# Patient Record
Sex: Male | Born: 1995 | Race: Black or African American | Hispanic: No | Marital: Single | Smoking: Current every day smoker
Health system: Southern US, Community
[De-identification: ages and names within clinical notes are randomized; demographics above are authoritative.]

## PROBLEM LIST (undated history)

## (undated) DIAGNOSIS — F319 Bipolar disorder, unspecified: Secondary | ICD-10-CM

## (undated) DIAGNOSIS — F209 Schizophrenia, unspecified: Secondary | ICD-10-CM

## (undated) DIAGNOSIS — A499 Bacterial infection, unspecified: Secondary | ICD-10-CM

## (undated) DIAGNOSIS — R569 Unspecified convulsions: Secondary | ICD-10-CM

---
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
## 2015-01-21 ENCOUNTER — Emergency Department (HOSPITAL_COMMUNITY): Payer: No Typology Code available for payment source

## 2015-01-21 ENCOUNTER — Encounter (HOSPITAL_COMMUNITY): Payer: Self-pay | Admitting: Emergency Medicine

## 2015-01-21 ENCOUNTER — Emergency Department (HOSPITAL_COMMUNITY)
Admission: EM | Admit: 2015-01-21 | Discharge: 2015-01-21 | Disposition: A | Discharge status: Home / Self Care | Payer: Self-pay | Attending: Emergency Medicine | Admitting: Emergency Medicine

## 2015-01-21 DIAGNOSIS — Z8659 Personal history of other mental and behavioral disorders: Secondary | ICD-10-CM | POA: Insufficient documentation

## 2015-01-21 DIAGNOSIS — Z720 Tobacco use: Secondary | ICD-10-CM | POA: Insufficient documentation

## 2015-01-21 DIAGNOSIS — Y9302 Activity, running: Secondary | ICD-10-CM | POA: Insufficient documentation

## 2015-01-21 DIAGNOSIS — W010XXA Fall on same level from slipping, tripping and stumbling without subsequent striking against object, initial encounter: Secondary | ICD-10-CM | POA: Insufficient documentation

## 2015-01-21 DIAGNOSIS — Y998 Other external cause status: Secondary | ICD-10-CM | POA: Insufficient documentation

## 2015-01-21 DIAGNOSIS — Y9289 Other specified places as the place of occurrence of the external cause: Secondary | ICD-10-CM | POA: Insufficient documentation

## 2015-01-21 DIAGNOSIS — S92252A Displaced fracture of navicular [scaphoid] of left foot, initial encounter for closed fracture: Secondary | ICD-10-CM | POA: Insufficient documentation

## 2015-01-21 MED ORDER — IBUPROFEN 800 MG PO TABS
800.0000 mg | ORAL_TABLET | Freq: Once | ORAL | Status: AC
  Administered 2015-01-21: 800 mg via ORAL
  Filled 2015-01-21: qty 1

## 2015-01-21 NOTE — ED Notes (Signed)
Pt left in the custody of [facility] Police. Pt wheeled out of treatment area and to police car.

## 2015-01-21 NOTE — Discharge Instructions (Signed)
Tarsal Navicular Fracture Craig Hoffman, you have a small fracture of your foot.  See orthopaedic surgery within 3 days for close follow up.  Wear the cam walker boot at all times for protection.  If symptoms worsen, come back to the ED immediately.  Thank you. A fracture is a break in the bone. The tarsal navicular is a moderate sized bone of the midfoot on the inner side of the foot. This bone can be fractured in several ways and there are many different ways of treating these fractures. Some of the ways of treating these are as follows: TREATMENT   Immobilization, which means the fracture is casted as it is without changing the positions of the fracture (bone pieces) involved. This procedure is used when the bone fragments remain in their normal position (are not displaced).  ORIF (open reduction and internal fixation), in which the fracture site is opened and the bone pieces are fixed into place with some type of hardware (screws or pins). This type of repair is usually used when the body of the navicular is fractured. It is indicated in most fractures that are displaced. Your caregiver will discuss the type of fracture you have and the treatment that will be best for that problem. If surgery is the treatment of choice, the following is information for you to know and also let your caregiver know about prior to surgery.  LET YOUR CAREGIVER KNOW ABOUT:  Allergies.  Medicine taken including herbs, eye drops, over the counter medications, and creams.  Use of steroids (by mouth or creams)  Previous problems with anesthetics or novocaine.  Possibility of pregnancy, if this applies.  History of blood clots (thrombophlebitis).  History of bleeding or blood problems.  Previous surgery.  Other health problems.  Family history of anesthetic problems. AFTER THE PROCEDURE After surgery, you will be taken to the recovery area where a nurse will watch and check your progress. Once you are awake,  stable, and taking fluids well, barring other problems you will be allowed to go home. Once home, an ice pack applied to your operative site may help with discomfort and keep the swelling down. Elevate your foot above your heart as much as possible for the first 4-6 days after surgery. HOME CARE INSTRUCTIONS   Follow your caregiver's instructions as to activities, exercises, physical therapy, and driving a car.  Daily exercise is helpful to prevent return of problems. Maintain strength and range of motion as instructed.  Only take over-the-counter or prescription medicines for pain, discomfort, or fever as directed by your caregiver.  See your caregiver as directed. It is very important to keep all follow-up referrals and appointments in order to avoid any long-term problems with your ankle and foot including chronic pain, inability to move the ankle or foot normally, and permanent disability. SEEK MEDICAL CARE IF:   Increased bleeding (more than a small spot) from the wound or from beneath your cast or splint.  Redness, swelling, or increasing pain in the wound or from beneath your cast or splint.  Pus coming from wound or from beneath the cast, splint or fracture boot.  An unexplained oral temperature over 102 F (38.9 C).  A foul smell coming from the wound or dressing or from beneath your cast or splint. SEEK IMMEDIATE MEDICAL CARE IF:   You begin to lose feeling in your foot or toes, or develop swelling of the foot or toes.  You get a cold or blue foot or toes on  the injured side.  You develop pain not relieved by medicine.  You develop a rash.  You have difficulty breathing.  You have any allergic problems. If you do not have a window in your cast for observing the wound, a discharge or minor bleeding may show up as a stain on the outside of your cast. Report these findings to your caregiver. If you are placed into a fracture boot after your operation, you should expect minor  bleeding through the dressings. Change the dressings as instructed by your caregiver. Document Released: 08/14/2000 Document Revised: 07/25/2011 Document Reviewed: 07/05/2013 [facility] Patient Information 2015 [place], [place]. This information is not intended to replace advice given to you by your health care provider. Make sure you discuss any questions you have with your health care provider.

## 2015-01-21 NOTE — ED Notes (Signed)
Pt returned from xray
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
## 2015-01-27 ENCOUNTER — Emergency Department (HOSPITAL_COMMUNITY)
Admission: EM | Admit: 2015-01-27 | Discharge: 2015-01-27 | Disposition: A | Discharge status: Home / Self Care | Payer: No Typology Code available for payment source | Attending: Emergency Medicine | Admitting: Emergency Medicine

## 2015-01-27 ENCOUNTER — Encounter (HOSPITAL_COMMUNITY): Payer: Self-pay | Admitting: Vascular Surgery

## 2015-01-27 DIAGNOSIS — S92902A Unspecified fracture of left foot, initial encounter for closed fracture: Secondary | ICD-10-CM | POA: Insufficient documentation

## 2015-01-27 DIAGNOSIS — X58XXXA Exposure to other specified factors, initial encounter: Secondary | ICD-10-CM | POA: Insufficient documentation

## 2015-01-27 DIAGNOSIS — Y9302 Activity, running: Secondary | ICD-10-CM | POA: Insufficient documentation

## 2015-01-27 DIAGNOSIS — Z720 Tobacco use: Secondary | ICD-10-CM | POA: Insufficient documentation

## 2015-01-27 DIAGNOSIS — Y998 Other external cause status: Secondary | ICD-10-CM | POA: Insufficient documentation

## 2015-01-27 DIAGNOSIS — Z8659 Personal history of other mental and behavioral disorders: Secondary | ICD-10-CM | POA: Insufficient documentation

## 2015-01-27 DIAGNOSIS — Y9289 Other specified places as the place of occurrence of the external cause: Secondary | ICD-10-CM | POA: Insufficient documentation
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
## 2015-01-27 NOTE — Discharge Instructions (Signed)
Please use ibuprofen, Tylenol, ice, and follow-up with orthopedist for further evaluation and management

## 2015-01-27 NOTE — ED Notes (Signed)
Pt reports to the ED for eval of left ankle pain. He was seen here and had X-rays done and was told that he had a broken bone in his foot. Pt reports he was not given anything for pain and it has been hurting a lot. Pt A&OX4, resp e/u, and skin warm and dry.

## 2015-03-01 ENCOUNTER — Encounter (HOSPITAL_COMMUNITY): Payer: Self-pay | Admitting: *Deleted

## 2015-03-01 ENCOUNTER — Emergency Department (HOSPITAL_COMMUNITY): Payer: Medicaid Other

## 2015-03-01 ENCOUNTER — Inpatient Hospital Stay (HOSPITAL_COMMUNITY)
Admission: EM | Admit: 2015-03-01 | Discharge: 2015-03-06 | DRG: 552 | Disposition: A | Discharge status: Rehabilitation Facility | Payer: Medicaid Other | Attending: General Surgery | Admitting: General Surgery

## 2015-03-01 DIAGNOSIS — S129XXA Fracture of neck, unspecified, initial encounter: Secondary | ICD-10-CM | POA: Diagnosis present

## 2015-03-01 DIAGNOSIS — E871 Hypo-osmolality and hyponatremia: Secondary | ICD-10-CM | POA: Diagnosis present

## 2015-03-01 DIAGNOSIS — W3400XA Accidental discharge from unspecified firearms or gun, initial encounter: Secondary | ICD-10-CM | POA: Diagnosis not present

## 2015-03-01 DIAGNOSIS — I10 Essential (primary) hypertension: Secondary | ICD-10-CM | POA: Diagnosis present

## 2015-03-01 DIAGNOSIS — R270 Ataxia, unspecified: Secondary | ICD-10-CM | POA: Diagnosis present

## 2015-03-01 DIAGNOSIS — S14104A Unspecified injury at C4 level of cervical spinal cord, initial encounter: Secondary | ICD-10-CM | POA: Diagnosis present

## 2015-03-01 DIAGNOSIS — S12300B Unspecified displaced fracture of fourth cervical vertebra, initial encounter for open fracture: Principal | ICD-10-CM | POA: Diagnosis present

## 2015-03-01 DIAGNOSIS — F17200 Nicotine dependence, unspecified, uncomplicated: Secondary | ICD-10-CM | POA: Diagnosis present

## 2015-03-01 DIAGNOSIS — D72829 Elevated white blood cell count, unspecified: Secondary | ICD-10-CM | POA: Diagnosis present

## 2015-03-01 DIAGNOSIS — S1193XA Puncture wound without foreign body of unspecified part of neck, initial encounter: Secondary | ICD-10-CM

## 2015-03-01 DIAGNOSIS — T1490XA Injury, unspecified, initial encounter: Secondary | ICD-10-CM

## 2015-03-01 HISTORY — DX: Bacterial infection, unspecified: A49.9

## 2015-03-01 LAB — TYPE AND SCREEN
ABO/RH(D): B POS
Antibody Screen: NEGATIVE
UNIT DIVISION: 0
Unit division: 0

## 2015-03-01 LAB — CBC
HCT: 43.2 % (ref 39.0–52.0)
HEMOGLOBIN: 15 g/dL (ref 13.0–17.0)
MCH: 29.4 pg (ref 26.0–34.0)
MCHC: 34.7 g/dL (ref 30.0–36.0)
MCV: 84.5 fL (ref 78.0–100.0)
PLATELETS: 150 10*3/uL (ref 150–400)
RBC: 5.11 MIL/uL (ref 4.22–5.81)
RDW: 13.7 % (ref 11.5–15.5)
WBC: 6.5 10*3/uL (ref 4.0–10.5)

## 2015-03-01 LAB — COMPREHENSIVE METABOLIC PANEL
ALT: 24 U/L (ref 17–63)
AST: 25 U/L (ref 15–41)
Albumin: 4.3 g/dL (ref 3.5–5.0)
Alkaline Phosphatase: 49 U/L (ref 38–126)
Anion gap: 10 (ref 5–15)
BILIRUBIN TOTAL: 0.8 mg/dL (ref 0.3–1.2)
BUN: 7 mg/dL (ref 6–20)
CHLORIDE: 99 mmol/L — AB (ref 101–111)
CO2: 25 mmol/L (ref 22–32)
Calcium: 9.1 mg/dL (ref 8.9–10.3)
Creatinine, Ser: 1.25 mg/dL — ABNORMAL HIGH (ref 0.61–1.24)
Glucose, Bld: 97 mg/dL (ref 65–99)
POTASSIUM: 3.1 mmol/L — AB (ref 3.5–5.1)
Sodium: 134 mmol/L — ABNORMAL LOW (ref 135–145)
TOTAL PROTEIN: 6.7 g/dL (ref 6.5–8.1)

## 2015-03-01 LAB — PROTIME-INR
INR: 1.03 (ref 0.00–1.49)
PROTHROMBIN TIME: 13.7 s (ref 11.6–15.2)

## 2015-03-01 LAB — CDS SEROLOGY

## 2015-03-01 LAB — ETHANOL

## 2015-03-01 LAB — ABO/RH: ABO/RH(D): B POS

## 2015-03-01 MED ORDER — MORPHINE SULFATE (PF) 2 MG/ML IV SOLN
2.0000 mg | INTRAVENOUS | Status: DC | PRN
  Administered 2015-03-01 – 2015-03-02 (×4): 2 mg via INTRAVENOUS
  Filled 2015-03-01 (×4): qty 1

## 2015-03-01 MED ORDER — SODIUM CHLORIDE 0.9 % IV SOLN
INTRAVENOUS | Status: DC
  Administered 2015-03-01: 100 mL/h via INTRAVENOUS
  Administered 2015-03-02: 06:00:00 via INTRAVENOUS

## 2015-03-01 MED ORDER — OXYCODONE HCL 5 MG PO TABS
5.0000 mg | ORAL_TABLET | ORAL | Status: DC | PRN
  Administered 2015-03-01: 5 mg via ORAL
  Filled 2015-03-01: qty 1

## 2015-03-01 MED ORDER — ONDANSETRON HCL 4 MG/2ML IJ SOLN
4.0000 mg | Freq: Four times a day (QID) | INTRAMUSCULAR | Status: DC | PRN
  Administered 2015-03-01: 4 mg via INTRAVENOUS
  Filled 2015-03-01: qty 2

## 2015-03-01 MED ORDER — ONDANSETRON HCL 4 MG PO TABS: 4.0000 mg | ORAL_TABLET | Freq: Four times a day (QID) | ORAL | Status: DC | PRN

## 2015-03-01 MED ORDER — MORPHINE SULFATE (PF) 4 MG/ML IV SOLN
4.0000 mg | Freq: Once | INTRAVENOUS | Status: AC
  Administered 2015-03-01: 4 mg via INTRAVENOUS
  Filled 2015-03-01: qty 1

## 2015-03-01 MED ORDER — IOHEXOL 350 MG/ML SOLN
100.0000 mL | Freq: Once | INTRAVENOUS | Status: AC | PRN
  Administered 2015-03-01: 100 mL via INTRAVENOUS

## 2015-03-01 NOTE — ED Notes (Signed)
Police Archivistdetective at the bedside
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
## 2015-03-01 NOTE — ED Notes (Signed)
Family at the bedside.
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
## 2015-03-01 NOTE — ED Notes (Signed)
Pt returned from ct
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
## 2015-03-01 NOTE — ED Notes (Signed)
To ct

## 2015-03-01 NOTE — ED Notes (Signed)
Family updated as to patient's status.
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
## 2015-03-01 NOTE — ED Notes (Signed)
Pain med given for bi-lateral shoulder and arm pain

## 2015-03-01 NOTE — ED Notes (Signed)
Dr Venetia Maxonstern and dr Sheliah Hatchkinsinger at the bedsdie
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
## 2015-03-02 LAB — PREPARE FRESH FROZEN PLASMA
UNIT DIVISION: 0
Unit division: 0

## 2015-03-02 LAB — BASIC METABOLIC PANEL
ANION GAP: 9 (ref 5–15)
BUN: <5 mg/dL — ABNORMAL LOW (ref 6–20)
CALCIUM: 8.6 mg/dL — AB (ref 8.9–10.3)
CO2: 24 mmol/L (ref 22–32)
Chloride: 99 mmol/L — ABNORMAL LOW (ref 101–111)
Creatinine, Ser: 1.01 mg/dL (ref 0.61–1.24)
Glucose, Bld: 125 mg/dL — ABNORMAL HIGH (ref 65–99)
POTASSIUM: 3.7 mmol/L (ref 3.5–5.1)
Sodium: 132 mmol/L — ABNORMAL LOW (ref 135–145)

## 2015-03-02 LAB — CBC
HEMATOCRIT: 40.7 % (ref 39.0–52.0)
HEMOGLOBIN: 13.9 g/dL (ref 13.0–17.0)
MCH: 28.6 pg (ref 26.0–34.0)
MCHC: 34.2 g/dL (ref 30.0–36.0)
MCV: 83.7 fL (ref 78.0–100.0)
Platelets: 160 10*3/uL (ref 150–400)
RBC: 4.86 MIL/uL (ref 4.22–5.81)
RDW: 13.8 % (ref 11.5–15.5)
WBC: 10.6 10*3/uL — AB (ref 4.0–10.5)

## 2015-03-02 LAB — MRSA PCR SCREENING: MRSA by PCR: NEGATIVE

## 2015-03-02 LAB — BLOOD PRODUCT ORDER (VERBAL) VERIFICATION

## 2015-03-02 MED ORDER — HYDROMORPHONE HCL 1 MG/ML IJ SOLN
0.5000 mg | INTRAMUSCULAR | Status: DC | PRN
  Administered 2015-03-02 (×4): 0.5 mg via INTRAVENOUS
  Filled 2015-03-02 (×4): qty 1

## 2015-03-02 MED ORDER — PREGABALIN 25 MG PO CAPS
50.0000 mg | ORAL_CAPSULE | Freq: Two times a day (BID) | ORAL | Status: DC
  Administered 2015-03-02 – 2015-03-03 (×3): 50 mg via ORAL
  Filled 2015-03-02 (×2): qty 2
  Filled 2015-03-02: qty 1

## 2015-03-02 MED ORDER — HYDROMORPHONE HCL 1 MG/ML IJ SOLN
1.0000 mg | INTRAMUSCULAR | Status: DC | PRN
  Administered 2015-03-02 (×2): 1 mg via INTRAVENOUS
  Administered 2015-03-02 – 2015-03-03 (×3): 2 mg via INTRAVENOUS
  Filled 2015-03-02: qty 2
  Filled 2015-03-02: qty 1
  Filled 2015-03-02 (×2): qty 2
  Filled 2015-03-02: qty 1

## 2015-03-02 MED ORDER — OXYCODONE HCL 5 MG PO TABS
5.0000 mg | ORAL_TABLET | ORAL | Status: DC | PRN
  Administered 2015-03-02 – 2015-03-03 (×4): 10 mg via ORAL
  Filled 2015-03-02 (×4): qty 2

## 2015-03-02 NOTE — Progress Notes (Signed)
Patient states, "this medicine (morphine) is not doing anything." RN repositioned patient with no effect. MD Kinsinger notified. Orders to d/c PRN IV Morphine and order IV Dilaudid 0.5 mg Q1hr PRN
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
## 2015-03-02 NOTE — Progress Notes (Signed)
MD Kinsinger on unit. Notified that patient complained of increased numbing of both hands. No new orders at this time.

## 2015-03-02 NOTE — Progress Notes (Signed)
Rehab Admissions Coordinator Note:  Patient was screened by Trish MageLogue, Para Cossey M for appropriateness for an Inpatient Acute Rehab Consult.  At this time, we are recommending Inpatient Rehab consult.  Trish MageLogue, Shadae Reino M 03/02/2015, 3:15 PM  I can be reached at [phone].
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
## 2015-03-02 NOTE — Progress Notes (Signed)
Utilization review completed. Otie Headlee, RN, BSN. 

## 2015-03-03 DIAGNOSIS — R208 Other disturbances of skin sensation: Secondary | ICD-10-CM

## 2015-03-03 DIAGNOSIS — S14104A Unspecified injury at C4 level of cervical spinal cord, initial encounter: Secondary | ICD-10-CM | POA: Diagnosis present

## 2015-03-03 DIAGNOSIS — S1193XA Puncture wound without foreign body of unspecified part of neck, initial encounter: Secondary | ICD-10-CM

## 2015-03-03 DIAGNOSIS — S129XXA Fracture of neck, unspecified, initial encounter: Secondary | ICD-10-CM

## 2015-03-03 DIAGNOSIS — W3400XA Accidental discharge from unspecified firearms or gun, initial encounter: Secondary | ICD-10-CM

## 2015-03-03 MED ORDER — OXYCODONE HCL 5 MG PO TABS
10.0000 mg | ORAL_TABLET | ORAL | Status: DC | PRN
  Administered 2015-03-03 – 2015-03-04 (×5): 20 mg via ORAL
  Administered 2015-03-05: 10 mg via ORAL
  Administered 2015-03-05 (×3): 20 mg via ORAL
  Administered 2015-03-06: 10 mg via ORAL
  Administered 2015-03-06: 20 mg via ORAL
  Filled 2015-03-03 (×4): qty 4
  Filled 2015-03-03: qty 2
  Filled 2015-03-03: qty 4
  Filled 2015-03-03: qty 2
  Filled 2015-03-03: qty 4
  Filled 2015-03-03: qty 2
  Filled 2015-03-03 (×2): qty 4
  Filled 2015-03-03: qty 2

## 2015-03-03 MED ORDER — ENOXAPARIN SODIUM 40 MG/0.4ML ~~LOC~~ SOLN
40.0000 mg | SUBCUTANEOUS | Status: DC
  Administered 2015-03-03 – 2015-03-06 (×4): 40 mg via SUBCUTANEOUS
  Filled 2015-03-03 (×4): qty 0.4

## 2015-03-03 MED ORDER — POLYETHYLENE GLYCOL 3350 17 G PO PACK
17.0000 g | PACK | Freq: Every day | ORAL | Status: DC
  Administered 2015-03-03 – 2015-03-06 (×4): 17 g via ORAL
  Filled 2015-03-03 (×4): qty 1

## 2015-03-03 MED ORDER — PREGABALIN 75 MG PO CAPS
75.0000 mg | ORAL_CAPSULE | Freq: Two times a day (BID) | ORAL | Status: DC
  Administered 2015-03-03: 75 mg via ORAL
  Filled 2015-03-03: qty 1

## 2015-03-03 MED ORDER — HYDROMORPHONE HCL 1 MG/ML IJ SOLN
0.5000 mg | INTRAMUSCULAR | Status: DC | PRN
  Administered 2015-03-03 – 2015-03-06 (×7): 0.5 mg via INTRAVENOUS
  Filled 2015-03-03 (×8): qty 1

## 2015-03-03 MED ORDER — DOCUSATE SODIUM 100 MG PO CAPS
100.0000 mg | ORAL_CAPSULE | Freq: Two times a day (BID) | ORAL | Status: DC
  Administered 2015-03-03 – 2015-03-06 (×7): 100 mg via ORAL
  Filled 2015-03-03 (×8): qty 1

## 2015-03-03 NOTE — Progress Notes (Signed)
Inpatient Rehabilitation  I met with the patient, his mom and his grandmother at the bedside to discuss recommendation of IP Rehab for pt's rehab recovery.  Pt. Is agreeable for rehab, stating "why wouldn't I want to do it?".  Mom and grandmother supportive and in agreement.  Will follow up tomorrow for medical readiness and bed availability.  Please call if questions.  [facility] Admissions Coordinator Cell [phone] Office [phone]
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
## 2015-03-03 NOTE — Progress Notes (Signed)
PT Cancellation Note  Patient Details Name: Craig Hoffman MRN: [id number] DOB: 08/11/1995   Cancelled Treatment:    Reason Eval/Treat Not Completed: Patient at procedure or test/unavailable Pt meeting with inpatient rehab coordinator. Will follow up at next available time.   Blake DivineShauna A Thi Sisemore 03/03/2015, 3:20 PM  Mylo RedShauna Kymberlyn Eckford, PT, DPT [phone]
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
## 2015-03-04 ENCOUNTER — Encounter (HOSPITAL_COMMUNITY): Payer: Self-pay | Admitting: Physical Medicine and Rehabilitation

## 2015-03-04 MED ORDER — KETOROLAC TROMETHAMINE 30 MG/ML IJ SOLN
30.0000 mg | Freq: Once | INTRAMUSCULAR | Status: AC
  Administered 2015-03-04: 30 mg via INTRAVENOUS
  Filled 2015-03-04: qty 1

## 2015-03-04 MED ORDER — PREGABALIN 75 MG PO CAPS
100.0000 mg | ORAL_CAPSULE | Freq: Two times a day (BID) | ORAL | Status: DC
  Administered 2015-03-04 – 2015-03-06 (×5): 100 mg via ORAL
  Filled 2015-03-04 (×10): qty 1

## 2015-03-04 MED ORDER — TRAMADOL HCL 50 MG PO TABS
100.0000 mg | ORAL_TABLET | Freq: Four times a day (QID) | ORAL | Status: DC
  Administered 2015-03-04 – 2015-03-06 (×8): 100 mg via ORAL
  Filled 2015-03-04 (×8): qty 2

## 2015-03-04 MED ORDER — KETOROLAC TROMETHAMINE 15 MG/ML IJ SOLN
15.0000 mg | Freq: Four times a day (QID) | INTRAMUSCULAR | Status: AC
  Administered 2015-03-04 – 2015-03-06 (×7): 15 mg via INTRAVENOUS
  Filled 2015-03-04 (×8): qty 1

## 2015-03-04 NOTE — Care Management Note (Addendum)
Case Management Note  Patient Details  Name: Craig Hoffman MRN: [id number] Date of Birth: 07/18/1995  Subjective/Objective:                    Action/Plan:  [facility] .    Spoke to patient and his mother [name]  [phone] at bedside . Both aware [facility] has no beds .   [name] interested in speaking with Financial Counselor to start Medicaid process .  [name] does not want to leave son's bedside to submit medicaid application . Spoke with [name] , Financial Counselor , first appointment would be Monday March 09, 2015 . [name] will call [name] to schedule appointment . [facility] quality [facility] and his pending number would be [phone] [id number].   Call[facility] , spoke with Elita QuickPam , they do not take uninsured  / Medicaid pending number patients.   [facility] Ramon Dredge( Karen Lawrence ) , [facility] [phone]   [name] asking if her son could go to SNF . Left message for SW.   Patient and mother aware of above . Expected Discharge Date:   (Pending)               Expected Discharge Plan:     In-House Referral:  Financial Counselor, Clinical Social Work  Discharge planning Services  CM Consult  Post Acute Care Choice:    Choice offered to:     DME Arranged:    DME Agency:     HH Arranged:    HH Agency:     Status of Service:  In process, will continue to follow  Medicare Important Message Given:    Date Medicare IM Given:    Medicare IM give by:    Date Additional Medicare IM Given:    Additional Medicare Important Message give by:     If discussed at Long Length of Stay Meetings, dates discussed:    Additional Comments:  Kingsley PlanWile, Soliana Kitko Marie, RN 03/04/2015, 1:36 PM
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
## 2015-03-04 NOTE — Progress Notes (Signed)
I await confirmation of bed availability for our IP Rehab program for  today.  I have updated Ronny FlurryHeather Wile CM that if I do not have a bed to offer pt. today, recommendation would be for IP Rehab at another facility .  Not sure if he would be accepted due to being uninsured.  [facility]  Pt. And family have been made aware.  I will update the team as soon as I know.  Please call if questions.  Weldon PickingSusan Lacheryl Niesen PT Inpatient Rehab Admissions Coordinator Cell [phone] Office [phone]
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
## 2015-03-05 MED ORDER — MORPHINE SULFATE ER 15 MG PO TBCR: 15.0000 mg | EXTENDED_RELEASE_TABLET | Freq: Two times a day (BID) | ORAL | Status: DC

## 2015-03-05 NOTE — Progress Notes (Signed)
Subjective: Patient reports "My shoulders hurt!"  Objective: Vital signs in last 24 hours: Temp:  [98.5 F (36.9 C)-98.9 F (37.2 C)] 98.9 F (37.2 C) (10/20 0612) Pulse Rate:  [80-84] 84 (10/20 0612) Resp:  [18-20] 20 (10/20 0612) BP: (151-178)/(95-105) 151/95 mmHg (10/20 0612) SpO2:  [98 %-100 %] 98 % (10/20 0612)  Intake/Output from previous day: 10/19 0701 - 10/20 0700 In: -  Out: 1625 [Urine:1625] Intake/Output this shift:    Mother present and helpful. Awakens to light touch, complains of bilateral shoulder pain. Strength remains good, limited by pain. Hypersensitivity persists BUE. Drsg changed left trapezius, no erythema or swelling, ?minimal drainage on wet to dry drsg. Pt tolerates drsg change complaining only when shoulders manipulated. Hard collar in use.  Lab Results: No results for input(s): WBC, HGB, HCT, PLT in the last 72 hours. BMET No results for input(s): NA, K, CL, CO2, GLUCOSE, BUN, CREATININE, CALCIUM in the last 72 hours.  Studies/Results: No results found.  Assessment/Plan:   LOS: 4 days  Wet to dry drsg changed. awaiting disposition re:CIR vs SNF.  Plan to remain in collar until office visit with DrStern. Mother verbalizes understanding. (pt somnolent after pain medication and distracted by pain this visit)   Sophonie Goforth, Arlys JohnBrian 03/05/2015, 10:14 AM

## 2015-03-05 NOTE — Progress Notes (Signed)
Rehab admissions - I met with patient and his mom.  Mom is planning to take patient home with her after rehab and provide supervision.  I will try to get a bed on inpatient rehab tomorrow or Saturday for admission.  Call me for questions.  #[phone]
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
## 2015-03-05 NOTE — Clinical Social Work Placement (Signed)
   CLINICAL SOCIAL WORK PLACEMENT  NOTE  Date:  03/05/2015  Patient Details  Name: [name] MRN: [id number] Date of Birth: 05/30/1995  Clinical Social Work is seeking post-discharge placement for this patient at the Skilled  Nursing Facility level of care (*CSW will initial, date and re-position this form in  chart as items are completed):  Yes   Patient/family provided with [facility] Clinical Social Work Department's list of facilities offering this level of care within the geographic area requested by the patient (or if unable, by the patient's family).  Yes   Patient/family informed of their freedom to choose among providers that offer the needed level of care, that participate in Medicare, Medicaid or managed care program needed by the patient, have an available bed and are willing to accept the patient.  Yes   Patient/family informed of [facility]'s ownership interest in [facility] and [facility], as well as of the fact that they are under no obligation to receive care at these facilities.  PASRR submitted to EDS on       PASRR number received on       Existing PASRR number confirmed on       FL2 transmitted to all facilities in geographic area requested by pt/family on 03/05/15     FL2 transmitted to all facilities within larger geographic area on       Patient informed that his/her managed care company has contracts with or will negotiate with certain facilities, including the following:            Patient/family informed of bed offers received.  Patient chooses bed at       Physician recommends and patient chooses bed at      Patient to be transferred to   on  .  Patient to be transferred to facility by       Patient family notified on   of transfer.  Name of family member notified:        PHYSICIAN       Additional Comment:   CSW has made SNF referrals for a letter of guarantee bed in the event and inpatient rehab cannot accept the  patient.  _______________________________________________ Roddie McBryant Kylar Leonhardt MSW, LCSW, [place], [phone]

## 2015-03-06 ENCOUNTER — Inpatient Hospital Stay (HOSPITAL_COMMUNITY)
Admission: AD | Admit: 2015-03-06 | Discharge: 2015-03-18 | DRG: 560 | Disposition: A | Discharge status: Home Health | Payer: Medicaid Other | Source: Intra-hospital | Attending: Physical Medicine & Rehabilitation | Admitting: Physical Medicine & Rehabilitation

## 2015-03-06 ENCOUNTER — Encounter (HOSPITAL_COMMUNITY): Payer: Self-pay | Admitting: *Deleted

## 2015-03-06 DIAGNOSIS — K5900 Constipation, unspecified: Secondary | ICD-10-CM | POA: Diagnosis present

## 2015-03-06 DIAGNOSIS — F419 Anxiety disorder, unspecified: Secondary | ICD-10-CM | POA: Diagnosis present

## 2015-03-06 DIAGNOSIS — R208 Other disturbances of skin sensation: Secondary | ICD-10-CM

## 2015-03-06 DIAGNOSIS — E875 Hyperkalemia: Secondary | ICD-10-CM | POA: Diagnosis present

## 2015-03-06 DIAGNOSIS — M542 Cervicalgia: Secondary | ICD-10-CM | POA: Diagnosis present

## 2015-03-06 DIAGNOSIS — F332 Major depressive disorder, recurrent severe without psychotic features: Secondary | ICD-10-CM | POA: Diagnosis present

## 2015-03-06 DIAGNOSIS — S1193XA Puncture wound without foreign body of unspecified part of neck, initial encounter: Secondary | ICD-10-CM

## 2015-03-06 DIAGNOSIS — G253 Myoclonus: Secondary | ICD-10-CM | POA: Diagnosis not present

## 2015-03-06 DIAGNOSIS — M62838 Other muscle spasm: Secondary | ICD-10-CM | POA: Diagnosis not present

## 2015-03-06 DIAGNOSIS — W3400XA Accidental discharge from unspecified firearms or gun, initial encounter: Secondary | ICD-10-CM

## 2015-03-06 DIAGNOSIS — E871 Hypo-osmolality and hyponatremia: Secondary | ICD-10-CM | POA: Diagnosis present

## 2015-03-06 DIAGNOSIS — T404X5A Adverse effect of other synthetic narcotics, initial encounter: Secondary | ICD-10-CM | POA: Diagnosis not present

## 2015-03-06 DIAGNOSIS — S12300D Unspecified displaced fracture of fourth cervical vertebra, subsequent encounter for fracture with routine healing: Secondary | ICD-10-CM | POA: Diagnosis not present

## 2015-03-06 DIAGNOSIS — S12300A Unspecified displaced fracture of fourth cervical vertebra, initial encounter for closed fracture: Secondary | ICD-10-CM | POA: Diagnosis present

## 2015-03-06 DIAGNOSIS — S129XXA Fracture of neck, unspecified, initial encounter: Secondary | ICD-10-CM

## 2015-03-06 DIAGNOSIS — S14104A Unspecified injury at C4 level of cervical spinal cord, initial encounter: Secondary | ICD-10-CM | POA: Diagnosis present

## 2015-03-06 DIAGNOSIS — R52 Pain, unspecified: Secondary | ICD-10-CM

## 2015-03-06 LAB — COMPREHENSIVE METABOLIC PANEL
ALBUMIN: 3.8 g/dL (ref 3.5–5.0)
ALK PHOS: 49 U/L (ref 38–126)
ALT: 45 U/L (ref 17–63)
AST: 41 U/L (ref 15–41)
Anion gap: 10 (ref 5–15)
BUN: 9 mg/dL (ref 6–20)
CALCIUM: 9.9 mg/dL (ref 8.9–10.3)
CHLORIDE: 91 mmol/L — AB (ref 101–111)
CO2: 29 mmol/L (ref 22–32)
Creatinine, Ser: 1.17 mg/dL (ref 0.61–1.24)
GFR calc non Af Amer: >60 mL/min (ref >60)
GLUCOSE: 107 mg/dL — AB (ref 65–99)
POTASSIUM: 5.3 mmol/L — AB (ref 3.5–5.1)
SODIUM: 130 mmol/L — AB (ref 135–145)
Total Bilirubin: 0.7 mg/dL (ref 0.3–1.2)
Total Protein: 7.2 g/dL (ref 6.5–8.1)

## 2015-03-06 LAB — CBC WITH DIFFERENTIAL/PLATELET
BASOS ABS: 0.1 10*3/uL (ref 0.0–0.1)
Basophils Relative: 1 %
EOS ABS: 0.3 10*3/uL (ref 0.0–0.7)
Eosinophils Relative: 5 %
HCT: 45.9 % (ref 39.0–52.0)
HEMOGLOBIN: 15.7 g/dL (ref 13.0–17.0)
LYMPHS ABS: 1.5 10*3/uL (ref 0.7–4.0)
Lymphocytes Relative: 22 %
MCH: 29 pg (ref 26.0–34.0)
MCHC: 34.2 g/dL (ref 30.0–36.0)
MCV: 84.8 fL (ref 78.0–100.0)
MONOS PCT: 15 %
Monocytes Absolute: 1 10*3/uL (ref 0.1–1.0)
NEUTROS ABS: 4 10*3/uL (ref 1.7–7.7)
Neutrophils Relative %: 57 %
PLATELETS: 173 10*3/uL (ref 150–400)
RBC: 5.41 MIL/uL (ref 4.22–5.81)
RDW: 13.4 % (ref 11.5–15.5)
WBC: 6.9 10*3/uL (ref 4.0–10.5)

## 2015-03-06 MED ORDER — PROCHLORPERAZINE MALEATE 5 MG PO TABS: 5.0000 mg | ORAL_TABLET | Freq: Four times a day (QID) | ORAL | Status: DC | PRN

## 2015-03-06 MED ORDER — GUAIFENESIN-DM 100-10 MG/5ML PO SYRP
5.0000 mL | ORAL_SOLUTION | Freq: Four times a day (QID) | ORAL | Status: DC | PRN
Start: 2015-03-06 — End: 2015-03-18

## 2015-03-06 MED ORDER — ONDANSETRON HCL 4 MG/2ML IJ SOLN: 4.0000 mg | Freq: Four times a day (QID) | INTRAMUSCULAR | Status: DC | PRN

## 2015-03-06 MED ORDER — ACETAMINOPHEN 325 MG PO TABS
325.0000 mg | ORAL_TABLET | ORAL | Status: DC | PRN
  Administered 2015-03-18: 650 mg via ORAL
  Filled 2015-03-06: qty 2

## 2015-03-06 MED ORDER — DOCUSATE SODIUM 100 MG PO CAPS: 100.0000 mg | ORAL_CAPSULE | Freq: Two times a day (BID) | ORAL | Status: DC

## 2015-03-06 MED ORDER — OXYCODONE HCL 5 MG PO TABS
20.0000 mg | ORAL_TABLET | ORAL | Status: DC | PRN
  Administered 2015-03-06 – 2015-03-07 (×6): 20 mg via ORAL
  Administered 2015-03-07 – 2015-03-08 (×2): 10 mg via ORAL
  Administered 2015-03-08: 20 mg via ORAL
  Administered 2015-03-08: 10 mg via ORAL
  Administered 2015-03-08 – 2015-03-11 (×11): 20 mg via ORAL
  Filled 2015-03-06 (×22): qty 4

## 2015-03-06 MED ORDER — PROCHLORPERAZINE 25 MG RE SUPP: 12.5000 mg | Freq: Four times a day (QID) | RECTAL | Status: DC | PRN

## 2015-03-06 MED ORDER — ALUM & MAG HYDROXIDE-SIMETH 200-200-20 MG/5ML PO SUSP: 30.0000 mL | ORAL | Status: DC | PRN

## 2015-03-06 MED ORDER — METHOCARBAMOL 500 MG PO TABS
500.0000 mg | ORAL_TABLET | Freq: Four times a day (QID) | ORAL | Status: DC | PRN
  Administered 2015-03-06 – 2015-03-10 (×7): 500 mg via ORAL
  Filled 2015-03-06 (×7): qty 1

## 2015-03-06 MED ORDER — TRAZODONE HCL 50 MG PO TABS: 25.0000 mg | ORAL_TABLET | Freq: Every evening | ORAL | Status: DC | PRN

## 2015-03-06 MED ORDER — FLEET ENEMA 7-19 GM/118ML RE ENEM: 1.0000 | ENEMA | Freq: Once | RECTAL | Status: DC | PRN

## 2015-03-06 MED ORDER — PROCHLORPERAZINE EDISYLATE 5 MG/ML IJ SOLN: 5.0000 mg | Freq: Four times a day (QID) | INTRAMUSCULAR | Status: DC | PRN

## 2015-03-06 MED ORDER — PREGABALIN 100 MG PO CAPS
100.0000 mg | ORAL_CAPSULE | Freq: Two times a day (BID) | ORAL | Status: DC
  Administered 2015-03-06 – 2015-03-07 (×3): 100 mg via ORAL
  Filled 2015-03-06 (×3): qty 1

## 2015-03-06 MED ORDER — DIPHENHYDRAMINE HCL 12.5 MG/5ML PO ELIX: 12.5000 mg | ORAL_SOLUTION | Freq: Four times a day (QID) | ORAL | Status: DC | PRN

## 2015-03-06 MED ORDER — SENNOSIDES-DOCUSATE SODIUM 8.6-50 MG PO TABS
2.0000 | ORAL_TABLET | Freq: Two times a day (BID) | ORAL | Status: DC
  Administered 2015-03-06 – 2015-03-17 (×21): 2 via ORAL
  Filled 2015-03-06 (×22): qty 2

## 2015-03-06 MED ORDER — BISACODYL 10 MG RE SUPP
10.0000 mg | Freq: Every day | RECTAL | Status: DC | PRN
Start: 2015-03-06 — End: 2015-03-18

## 2015-03-06 MED ORDER — POLYETHYLENE GLYCOL 3350 17 G PO PACK
17.0000 g | PACK | Freq: Every day | ORAL | Status: DC
  Administered 2015-03-06 – 2015-03-17 (×12): 17 g via ORAL
  Filled 2015-03-06 (×11): qty 1

## 2015-03-06 MED ORDER — ONDANSETRON HCL 4 MG PO TABS
4.0000 mg | ORAL_TABLET | Freq: Four times a day (QID) | ORAL | Status: DC | PRN
Start: 2015-03-06 — End: 2015-03-18

## 2015-03-06 MED ORDER — ENOXAPARIN SODIUM 40 MG/0.4ML ~~LOC~~ SOLN
40.0000 mg | SUBCUTANEOUS | Status: DC
  Administered 2015-03-07 – 2015-03-17 (×11): 40 mg via SUBCUTANEOUS
  Filled 2015-03-06 (×11): qty 0.4

## 2015-03-06 MED ORDER — TRAMADOL HCL 50 MG PO TABS
100.0000 mg | ORAL_TABLET | Freq: Four times a day (QID) | ORAL | Status: DC
  Administered 2015-03-06 – 2015-03-16 (×36): 100 mg via ORAL
  Filled 2015-03-06 (×38): qty 2

## 2015-03-06 NOTE — Care Management Note (Signed)
Case Management Note  Patient Details  Name: Craig Hoffman MRN: [id number] Date of Birth: 05/22/1995  Subjective/Objective:   [facility], and pt agreeable to plan.                 Action/Plan: Plan dc to CIR later today.      Expected Discharge Date:  03/06/2015 Expected Discharge Plan:  IP Rehab Facility  In-House Referral:  Financial Counselor, Clinical Social Work  Discharge planning Services  CM Consult  Post Acute Care Choice:    Choice offered to:     DME Arranged:    DME Agency:     HH Arranged:    HH Agency:     Status of Service:  Completed, signed off  Medicare Important Message Given:    Date Medicare IM Given:    Medicare IM give by:    Date Additional Medicare IM Given:    Additional Medicare Important Message give by:     If discussed at Long Length of Stay Meetings, dates discussed:    Additional Comments:  Quintella BatonJulie W. Porschia Willbanks, RN, BSN  Trauma/Neuro ICU Case Manager [phone]

## 2015-03-06 NOTE — Clinical Social Work Note (Signed)
Clinical Social Worker continuing to follow patient and family for support and discharge planning needs.  Per inpatient rehab admissions coordinator, patient mother plans to bring patient home following rehab stay.  Inpatient rehab hopeful to have a bed for patient today or tomorrow.  Clinical Social Worker will sign off for now as social work intervention is no longer needed. Please consult us again if new need arises.  Macario GoldsJesse Asha [place], [place] [phone]

## 2015-03-06 NOTE — Progress Notes (Signed)
Patient being transferred to rehab. Report called to the receiving nurse. 
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
## 2015-03-06 NOTE — Progress Notes (Signed)
Rehab admissions - Patient has been cleared by neuro surgeon for inpatient rehab admission.  Bed available and will admit to acute inpatient rehab today.  Call me for questions.  #[phone]
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
## 2015-03-06 NOTE — Progress Notes (Signed)
Subjective: Patient reports "My shoulders hurt."  Objective: Vital signs in last 24 hours: Temp:  [98.4 F (36.9 C)-99.5 F (37.5 C)] 98.4 F (36.9 C) (10/21 0600) Pulse Rate:  [68-89] 89 (10/21 0600) Resp:  [15-20] 20 (10/21 0600) BP: (147-167)/(89-101) 147/100 mmHg (10/21 0600) SpO2:  [96 %-100 %] 98 % (10/21 0600)  Intake/Output from previous day: 10/20 0701 - 10/21 0700 In: -  Out: 4300 [Urine:4300] Intake/Output this shift:    Sleeping, awakens to voice. Reports no changes. Father present advises he just finished massaging pts shoulders, arms, and hands prior to nurse visit. Pt apparently tolerated that better today than touching shoulders yesterday. Collar in use. Motor function remains good, limited by pain.  Lab Results: No results for input(s): WBC, HGB, HCT, PLT in the last 72 hours. BMET No results for input(s): NA, K, CL, CO2, GLUCOSE, BUN, CREATININE, CALCIUM in the last 72 hours.  Studies/Results: No results found.  Assessment/Plan:   LOS: 5 days  Hopefule of CIR today or tomorrow. Office follow up with DrStern in one month. Continue hard collar.   Georgiann Cockeroteat, Mariesha Venturella 03/06/2015, 9:09 AM
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
## 2015-03-07 ENCOUNTER — Inpatient Hospital Stay (HOSPITAL_COMMUNITY): Payer: Medicaid Other | Admitting: *Deleted

## 2015-03-07 ENCOUNTER — Inpatient Hospital Stay (HOSPITAL_COMMUNITY): Payer: Medicaid Other | Admitting: Occupational Therapy

## 2015-03-07 ENCOUNTER — Inpatient Hospital Stay (HOSPITAL_COMMUNITY): Payer: Medicaid Other

## 2015-03-07 DIAGNOSIS — R52 Pain, unspecified: Secondary | ICD-10-CM

## 2015-03-07 MED ORDER — CLONAZEPAM 0.5 MG PO TABS
0.2500 mg | ORAL_TABLET | Freq: Two times a day (BID) | ORAL | Status: DC | PRN
  Administered 2015-03-07: 0.25 mg via ORAL
  Filled 2015-03-07: qty 1

## 2015-03-07 NOTE — Progress Notes (Signed)
Informed by nursing, pt with Psych history with anxiety today.  Will add Klonipin PRN BID.

## 2015-03-07 NOTE — Progress Notes (Signed)
VASCULAR LAB PRELIMINARY  PRELIMINARY  PRELIMINARY  PRELIMINARY  Bilateral lower extremity venous duplex completed.    Preliminary report:  There is no DVT or SVT noted in the bilateral lower extremities.   Lamoyne Palencia, RVT 03/07/2015, 5:10 PM

## 2015-03-07 NOTE — Progress Notes (Signed)
03/07/15 1700 nursing RN followed up flu shot claims he does not want flu shots because it makes him sick.
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
## 2015-03-07 NOTE — Progress Notes (Signed)
03/07/15 1658 nursing NT got BP 163/102 left calf ; Vascular Tech in room said it will be higher because of the pressure.

## 2015-03-07 NOTE — Progress Notes (Signed)
1200 03/07/15 nursing Patient was upset re: visitation of friends and parents only wants immediate family members . Charge nurse notified,. Family requested anxiety medication. MD notified of situation. New orders noted.
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
## 2015-03-07 NOTE — Progress Notes (Signed)
Physical Therapy Session Note  Patient Details  Name: [name] MRN: [id number] Date of Birth: 12/27/1995  Today's Date: 03/07/2015 PT Individual Time: 1300-1330 PT Individual Time Calculation (min): 30 min   Short Term Goals: Week 1:  PT Short Term Goal 1 (Week 1): Patient will be able to perform all bed mobility with Supervision.  PT Short Term Goal 2 (Week 1): Patient will be able to perform transfer supine to sit with Supervision PT Short Term Goal 3 (Week 1): Patient will be able to perform bed to w/c transfers with SBA PT Short Term Goal 4 (Week 1): Patient will be able to participate in gait on a distance of 100 feet with min A with LRAD  Skilled Therapeutic Interventions/Progress Updates:  Session started with patient in a w/c ,able to participate in propulsion to the gym with occasional cues for path following.  NuStep x 5 min with resistance set at 2 ,inorder to increase strength, and facilitate reciprocal movements.  Gait Training with RW 1 x 50 feet and 1 x 80 feet with min to mod A-increased assistance as patient becomes more fatigued ,at the end of second gait session patient lost his balance and B Knee buckled, max A to regain posture.  Patient transferred with use of walker 3 x sit to stand with min A.  At the end of session transferred back to the room with all needs within reach , mother present in room.   Therapy Documentation Precautions:  Precautions Precautions: Fall, Cervical Required Braces or Orthoses: Cervical Brace Cervical Brace: Hard collar, At all times Restrictions Weight Bearing Restrictions: No  Pain Assessment Pain Assessment: 0-10 Pain Score: 8  Pain Type: Acute pain Pain Location: Neck Pain Orientation: Right;Left Pain Descriptors / Indicators: Aching Pain Onset: On-going Patients Stated Pain Goal: 2 Pain Intervention(s): Repositioned;Emotional support Multiple Pain Sites: No Mobility: Bed Mobility Bed Mobility: Rolling  Right;Rolling Left;Left Sidelying to Sit Rolling Right: 4: Min assist Rolling Left: 4: Min assist Left Sidelying to Sit: 4: Min assist Transfers Sit to Stand: 3: Mod assist;4: Min assist Sit to Stand Details: Tactile cues for initiation;Tactile cues for sequencing;Tactile cues for placement;Verbal cues for technique  Trunk/Postural Assessment : Cervical Assessment Cervical Assessment: Exceptions to WFL (aspen collar donned at all times) Thoracic Assessment Thoracic Assessment: Within Functional Limits Lumbar Assessment Lumbar Assessment: Within Functional Limits Postural Control Postural Control: Deficits on evaluation  Balance: Balance Balance Assessed: Yes Dynamic Sitting Balance Dynamic Sitting - Balance Support: Bilateral upper extremity supported;Feet supported Dynamic Sitting - Level of Assistance: 4: Min assist Static Standing Balance Static Standing - Balance Support: Right upper extremity supported;Left upper extremity supported;Bilateral upper extremity supported Static Standing - Level of Assistance: 3: Mod assist Dynamic Standing Balance Dynamic Standing - Balance Support: During functional activity;Left upper extremity supported;Right upper extremity supported Dynamic Standing - Level of Assistance: 3: Mod assist Dynamic Standing - Balance Activities: Reaching for objects;Reaching across midline;Lateral lean/weight shifting;Forward lean/weight shifting   See Function Navigator for Current Functional Status.   Therapy/Group: Individual Therapy  Dorna MaiCzajkowska, Tannia Contino W 03/07/2015, 3:22 PM

## 2015-03-07 NOTE — Progress Notes (Signed)
[facility] PHYSICAL MEDICINE & REHABILITATION     PROGRESS NOTE    Subjective/Complaints: Patient seen and examined sitting up in his bed this morning. He states that he has not fallen asleep since 2:00 in the morning and some of it is because of pain. He said he just received some pain medication and that has improved his symptoms.  ROS: + Pain in bilateral upper extremities. Denies CP, SOB, N/V/D  Objective: Vital Signs: Blood pressure 161/95, pulse 82, temperature 98.6 F (37 C), temperature source Oral, resp. rate 20, height 6' (1.829 m), weight 74.662 kg (164 lb 9.6 oz), SpO2 100 %. No results found.  Recent Labs  03/06/15 1630  WBC 6.9  HGB 15.7  HCT 45.9  PLT 173    Recent Labs  03/06/15 1630  NA 130*  K 5.3*  CL 91*  GLUCOSE 107*  BUN 9  CREATININE 1.17  CALCIUM 9.9   CBG (last 3)  No results for input(s): GLUCAP in the last 72 hours.  Wt Readings from Last 3 Encounters:  03/06/15 74.662 kg (164 lb 9.6 oz) (66 %*, Z = 0.42)  03/01/15 72 kg (158 lb 11.7 oz) (58 %*, Z = 0.21)   * Growth percentiles are based on CDC 2-20 Years data.    Physical Exam:  BP 161/95 mmHg  Pulse 82  Temp(Src) 98.6 F (37 C) (Oral)  Resp 20  Ht 6' (1.829 m)  Wt 74.662 kg (164 lb 9.6 oz)  BMI 22.32 kg/m2  SpO2 100% Constitutional: He is oriented to person, place, and time. He appears well-developed and well-nourished. Vitals reviewed.  HENT:  Head: Normocephalic and atraumatic.  Eyes: Conjunctivae and EOM are normal.   Neck:  Immobilized by cervical collar.  Cardiovascular: Normal rate and regular rhythm.  Respiratory: Effort normal and breath sounds normal. No respiratory distress. He has no wheezes.  GI: Soft. Bowel sounds are normal. He exhibits no distension.  Musculoskeletal: He exhibits edema (b/l UE) and tenderness.  Unable to perform strength testing due to pain  Neurological: He is alert and oriented to person, place, and time.  BUE with significant  allodynia (R>L).  Unable to assess reflexes due to sensitivity  Skin: Skin is warm and dry. No rash noted. No pallor.  Psychiatric: His speech is normal. Cognition and memory are normal.    Assessment/Plan: 1. 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 Physiatrist is providing close team supervision and 24 hour management of active medical problems listed below. Physiatrist and rehab team continue to assess barriers to discharge/monitor patient progress toward functional and medical goals.  Function:  Bathing Bathing position      Bathing parts      Bathing assist        Upper Body Dressing/Undressing Upper body dressing                    Upper body assist        Lower Body Dressing/Undressing Lower body dressing                                  Lower body assist        Toileting Toileting     Toileting steps completed by helper: Performs perineal hygiene  Toileting assist Assist level: Touching or steadying assistance (Pt.75%)   Transfers Chair/bed Optician, dispensingtransfer             Locomotion Ambulation           Wheelchair          Cognition Comprehension    Expression    Social Interaction    Problem Solving    Memory      Medical Problem List and Plan: 1. 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, without spinal cord involvement  - Begin CIR 2. DVT Prophylaxis/Anticoagulation: Mechanical: Sequential compression devices, below knee Bilateral lower extremities 3. Pain Management: Ultram scheduled. Continue oxycodone prn for now. Lyrica increased to 100 mg bid 10/21.  4. Mood: LCSW to follow for evaluation and support.  5. Neuropsych: This patient is capable of  making decisions on his own behalf. 6. Skin/Wound Care: Routine pressure relief measures. Educate patient on boosting every 20 minute when in chair.  7. Fluids/Electrolytes/Nutrition: Monitor I/O.   -Hyponatremia, 130 on 10/21. Will cont to monitor and replace as necessary  -Hyperkalemia, 5.3 on 10/21. Will cont to monitor and consider purgative  -Will reorder labs for tomorroe  LOS (Days) 1 A FACE TO FACE EVALUATION WAS PERFORMED  Craig Hoffman 03/07/2015 7:45 AM

## 2015-03-08 MED ORDER — PREGABALIN 75 MG PO CAPS
150.0000 mg | ORAL_CAPSULE | Freq: Two times a day (BID) | ORAL | Status: DC
  Administered 2015-03-08 – 2015-03-09 (×4): 150 mg via ORAL
  Filled 2015-03-08 (×4): qty 2

## 2015-03-08 NOTE — Progress Notes (Signed)
[facility] PHYSICAL MEDICINE & REHABILITATION     PROGRESS NOTE    Subjective/Complaints: Patient seen this morning lying in bed. He appeared to be resting comfortably. When awoken and asked patient how he is doing, patient complains about burning pain in bilateral upper extremities. He also has questions about his imaging. His main concern is about numbness in his extremities. Patient also had anxiety yesterday evening and was provided medication.  ROS: + Pain and numbness in bilateral upper extremities. Denies CP, SOB, N/V/D  Objective: Vital Signs: Blood pressure 147/87, pulse 90, temperature 98.3 F (36.8 C), temperature source Oral, resp. rate 20, height 6' (1.829 m), weight 74.662 kg (164 lb 9.6 oz), SpO2 98 %. No results found.  Recent Labs  03/06/15 1630  WBC 6.9  HGB 15.7  HCT 45.9  PLT 173    Recent Labs  03/06/15 1630  NA 130*  K 5.3*  CL 91*  GLUCOSE 107*  BUN 9  CREATININE 1.17  CALCIUM 9.9   CBG (last 3)  No results for input(s): GLUCAP in the last 72 hours.  Wt Readings from Last 3 Encounters:  03/06/15 74.662 kg (164 lb 9.6 oz) (66 %*, Z = 0.42)  03/01/15 72 kg (158 lb 11.7 oz) (58 %*, Z = 0.21)   * Growth percentiles are based on CDC 2-20 Years data.    Physical Exam:  BP 147/87 mmHg  Pulse 90  Temp(Src) 98.3 F (36.8 C) (Oral)  Resp 20  Ht 6' (1.829 m)  Wt 74.662 kg (164 lb 9.6 oz)  BMI 22.32 kg/m2  SpO2 98% Constitutional: He is oriented to person, place, and time. He appears well-developed and well-nourished. Vitals reviewed.  HENT:  Head: Normocephalic and atraumatic.  Eyes: Conjunctivae and EOM are normal.   Neck:  Immobilized by cervical collar.  Cardiovascular: Normal rate and regular rhythm.  Respiratory: Effort normal and breath sounds normal. No respiratory distress. He has no wheezes.  GI: Soft. Bowel sounds are normal. He exhibits no distension.  Musculoskeletal: He exhibits edema (b/l UE) and tenderness.  Antigravity  strength throughout Neurological: He is alert and oriented.  BUE with significant allodynia (R>L).  Unable to assess reflexes due to sensitivity  Skin: Skin is warm and dry. No rash noted. No pallor.  Psychiatric: His speech is normal. Cognition and memory are normal.    Assessment/Plan: 1. 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 Physiatrist is providing close team supervision and 24 hour management of active medical problems listed below. Physiatrist and rehab team continue to assess barriers to discharge/monitor patient progress toward functional and medical goals.  Function:  Bathing Bathing position Bathing activity did not occur: Refused    Bathing parts      Bathing assist        Upper Body Dressing/Undressing Upper body dressing   What is the patient wearing?: Pull over shirt/dress     Pull over shirt/dress - Perfomed by patient: Thread/unthread right sleeve, Thread/unthread left sleeve Pull over shirt/dress - Perfomed by helper: Put head through opening        Upper body assist Assist Level: More than reasonable time, Touching or steadying assistance(Pt > 75%)      Lower Body Dressing/Undressing Lower body dressing   What is the patient wearing?: Underwear, Pants   Underwear - Performed by helper: Thread/unthread  left underwear leg, Thread/unthread right underwear leg, Pull underwear up/down   Pants- Performed by helper: Thread/unthread right pants leg, Thread/unthread left pants leg, Pull pants up/down                      Lower body assist        Toileting Toileting     Toileting steps completed by helper: Performs perineal hygiene    Toileting assist Assist level: Touching or steadying assistance (Pt.75%)   Transfers Chair/bed transfer    Chair/bed transfer method: Stand pivot Chair/bed transfer assist level: Touching or steadying assistance (Pt > 75%)       Locomotion Ambulation     Max distance: 100 Assist level: Moderate assist (Pt 50 - 74%)   Wheelchair Wheelchair activity did not occur: Safety/medical concerns Type: Manual      Cognition Comprehension Comprehension assist level: Understands basic 90% of the time/cues < 10% of the time  Expression Expression assist level: Expresses basic 90% of the time/requires cueing < 10% of the time.  Social Interaction Social Interaction assist level: Interacts appropriately 75 - 89% of the time - Needs redirection for appropriate language or to initiate interaction.  Problem Solving Problem solving assist level: Solves basic 75 - 89% of the time/requires cueing 10 - 24% of the time  Memory Memory assist level: Recognizes or recalls 75 - 89% of the time/requires cueing 10 - 24% of the time    Medical Problem List and Plan: 1. 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 DVT Prophylaxis/Anticoagulation: Mechanical: Sequential compression devices, below knee Bilateral lower extremities 3. Pain Management: Ultram scheduled. Continue oxycodone prn for now. Lyrica increased to 150 mg bid 10/23.  4. Mood: LCSW to follow for evaluation and support.  5. Neuropsych: This patient is capable of making decisions on his own behalf. 6. Skin/Wound Care: Routine pressure relief measures. Educate patient on boosting every 20 minute when in chair.  7. Fluids/Electrolytes/Nutrition: Monitor I/O.   -Pt eating 75-100% meals  -Hyponatremia, 130 on 10/21. Will cont to monitor and replace as necessary  -Hyperkalemia, 5.3 on 10/21. Will cont to monitor and consider purgative  -Will reorder labs for tomorrow 8. Anxiety  -Klonipin 0.25 PRN BID started  LOS (Days) 2 A FACE TO  FACE EVALUATION WAS PERFORMED  Ankit Karis Jubanil Patel 03/08/2015 7:44 AM

## 2015-03-08 NOTE — Progress Notes (Signed)
Complained of bilateral feel with "cramps", offered PRN robaxin. Patient's Mom, wanted to hold off on that med, for now. Patient wanting 20mg 's of PRN oxy IR, Dad requested to try 10mg , patient became very upset. Will continue to monitor. Craig MartinezMurray, Ashten Prats A

## 2015-03-09 ENCOUNTER — Inpatient Hospital Stay (HOSPITAL_COMMUNITY): Payer: Medicaid Other

## 2015-03-09 ENCOUNTER — Inpatient Hospital Stay (HOSPITAL_COMMUNITY): Payer: Medicaid Other | Admitting: Occupational Therapy

## 2015-03-09 ENCOUNTER — Inpatient Hospital Stay (HOSPITAL_COMMUNITY): Payer: Medicaid Other | Admitting: Physical Therapy

## 2015-03-09 DIAGNOSIS — S12390D Other displaced fracture of fourth cervical vertebra, subsequent encounter for fracture with routine healing: Secondary | ICD-10-CM

## 2015-03-09 DIAGNOSIS — F332 Major depressive disorder, recurrent severe without psychotic features: Secondary | ICD-10-CM | POA: Diagnosis present

## 2015-03-09 LAB — CBC WITH DIFFERENTIAL/PLATELET
BASOS ABS: 0 10*3/uL (ref 0.0–0.1)
BASOS PCT: 0 %
Eosinophils Absolute: 0.2 10*3/uL (ref 0.0–0.7)
Eosinophils Relative: 3 %
HEMATOCRIT: 45.7 % (ref 39.0–52.0)
HEMOGLOBIN: 15.7 g/dL (ref 13.0–17.0)
LYMPHS PCT: 16 %
Lymphs Abs: 1.5 10*3/uL (ref 0.7–4.0)
MCH: 28.9 pg (ref 26.0–34.0)
MCHC: 34.4 g/dL (ref 30.0–36.0)
MCV: 84.2 fL (ref 78.0–100.0)
Monocytes Absolute: 1.4 10*3/uL — ABNORMAL HIGH (ref 0.1–1.0)
Monocytes Relative: 15 %
NEUTROS ABS: 6 10*3/uL (ref 1.7–7.7)
NEUTROS PCT: 66 %
Platelets: 215 10*3/uL (ref 150–400)
RBC: 5.43 MIL/uL (ref 4.22–5.81)
RDW: 12.9 % (ref 11.5–15.5)
WBC: 8.7 10*3/uL (ref 4.0–10.5)

## 2015-03-09 LAB — BASIC METABOLIC PANEL
ANION GAP: 9 (ref 5–15)
BUN: 12 mg/dL (ref 6–20)
CALCIUM: 9.9 mg/dL (ref 8.9–10.3)
CHLORIDE: 91 mmol/L — AB (ref 101–111)
CO2: 31 mmol/L (ref 22–32)
Creatinine, Ser: 0.87 mg/dL (ref 0.61–1.24)
GFR calc non Af Amer: >60 mL/min (ref >60)
Glucose, Bld: 115 mg/dL — ABNORMAL HIGH (ref 65–99)
POTASSIUM: 4.2 mmol/L (ref 3.5–5.1)
Sodium: 131 mmol/L — ABNORMAL LOW (ref 135–145)

## 2015-03-09 MED ORDER — DULOXETINE HCL 30 MG PO CPEP
30.0000 mg | ORAL_CAPSULE | Freq: Every day | ORAL | Status: DC
  Administered 2015-03-09 – 2015-03-11 (×3): 30 mg via ORAL
  Filled 2015-03-09 (×4): qty 1

## 2015-03-09 MED ORDER — GABAPENTIN 100 MG PO CAPS
100.0000 mg | ORAL_CAPSULE | Freq: Two times a day (BID) | ORAL | Status: DC
  Administered 2015-03-09 – 2015-03-10 (×3): 100 mg via ORAL
  Filled 2015-03-09 (×2): qty 1

## 2015-03-09 NOTE — Progress Notes (Signed)
Orthopedic Tech Progress Note Patient Details:  [name] 04/09/1996 [id number] Called in advanced brace order; spoke with Juanda ChanceKanisha Patient ID: [name], male   DOB: 07/25/1995, 19 y.o.   MRN: [id number]   Nikki DomCrawford, Jessaca Philippi 03/09/2015, 10:51 AM

## 2015-03-09 NOTE — Progress Notes (Signed)
[facility] PHYSICAL MEDICINE & REHABILITATION     PROGRESS NOTE    Subjective/Complaints:  Some numbness in in arms and legs Buring pain entire arm to involve radial 3 digits ROS: + Pain and numbness in bilateral upper extremities. Denies CP, SOB, N/V/D  Objective: Vital Signs: Blood pressure 147/78, pulse 84, temperature 98.4 F (36.9 C), temperature source Oral, resp. rate 18, height 6' (1.829 m), weight 74.662 kg (164 lb 9.6 oz), SpO2 97 %. No results found.  Recent Labs  03/06/15 1630  WBC 6.9  HGB 15.7  HCT 45.9  PLT 173    Recent Labs  03/06/15 1630  NA 130*  K 5.3*  CL 91*  GLUCOSE 107*  BUN 9  CREATININE 1.17  CALCIUM 9.9   CBG (last 3)  No results for input(s): GLUCAP in the last 72 hours.  Wt Readings from Last 3 Encounters:  03/06/15 74.662 kg (164 lb 9.6 oz) (66 %*, Z = 0.42)  03/01/15 72 kg (158 lb 11.7 oz) (58 %*, Z = 0.21)   * Growth percentiles are based on CDC 2-20 Years data.    Physical Exam:  BP 147/78 mmHg  Pulse 84  Temp(Src) 98.4 F (36.9 C) (Oral)  Resp 18  Ht 6' (1.829 m)  Wt 74.662 kg (164 lb 9.6 oz)  BMI 22.32 kg/m2  SpO2 97% Constitutional: He is oriented to person, place, and time. He appears well-developed and well-nourished. Vitals reviewed.  HENT:  Head: Normocephalic and atraumatic.  Eyes: Conjunctivae and EOM are normal.   Neck:  Immobilized by cervical collar.  Cardiovascular: Normal rate and regular rhythm.  Respiratory: Effort normal and breath sounds normal. No respiratory distress. He has no wheezes.  GI: Soft. Bowel sounds are normal. He exhibits no distension.  Musculoskeletal: He exhibits no edema and no tenderness.  Antigravity strength throughout Neurological: He is alert and oriented.  BUE without  significant allodynia (R>L).  Unable to assess reflexes due to sensitivity  Skin: Skin is warm and dry. No rash noted. No pallor.  Psychiatric: His speech is normal. Cognition and memory are normal.     Assessment/Plan: 1. 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 Physiatrist is providing close team supervision and 24 hour management of active medical problems listed below. Physiatrist and rehab team continue to assess barriers to discharge/monitor patient progress toward functional and medical goals.  Function:  Bathing Bathing position Bathing activity did not occur: Refused    Bathing parts      Bathing assist        Upper Body Dressing/Undressing Upper body dressing   What is the patient wearing?: Pull over shirt/dress     Pull over shirt/dress - Perfomed by patient: Thread/unthread right sleeve, Thread/unthread left sleeve Pull over shirt/dress - Perfomed by helper: Put head through opening        Upper body assist Assist Level: More than reasonable time, Touching or steadying assistance(Pt > 75%)      Lower Body Dressing/Undressing Lower body dressing   What is the patient wearing?: Underwear, Pants   Underwear - Performed by helper: Thread/unthread left underwear leg, Thread/unthread right underwear leg, Pull underwear up/down   Pants- Performed by helper: Thread/unthread right pants leg, Thread/unthread left pants leg, Pull pants up/down  Lower body assist        Toileting Toileting Toileting activity did not occur: No continent bowel/bladder event (using urinal)   Toileting steps completed by helper: Performs perineal hygiene    Toileting assist Assist level: Touching or steadying assistance (Pt.75%)   Transfers Chair/bed transfer   Chair/bed transfer method: Stand pivot Chair/bed transfer assist level: Touching or steadying assistance (Pt > 75%)       Locomotion Ambulation     Max distance: 100 Assist level:  Moderate assist (Pt 50 - 74%)   Wheelchair Wheelchair activity did not occur: Safety/medical concerns Type: Manual      Cognition Comprehension Comprehension assist level: Understands basic 90% of the time/cues < 10% of the time  Expression Expression assist level: Expresses basic 90% of the time/requires cueing < 10% of the time.  Social Interaction Social Interaction assist level: Interacts appropriately 50 - 74% of the time - May be physically or verbally inappropriate.  Problem Solving Problem solving assist level: Solves basic 75 - 89% of the time/requires cueing 10 - 24% of the time  Memory Memory assist level: Recognizes or recalls 75 - 89% of the time/requires cueing 10 - 24% of the time    Medical Problem List and Plan: 1. 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 DVT Prophylaxis/Anticoagulation: Mechanical: Sequential compression devices, below knee Bilateral lower extremities 3. Pain Management: Ultram scheduled. Continue oxycodone prn for now. Lyrica increased to 150 mg bid 10/23. will need additional time to assess effect, no allodynia 4. Mood: LCSW to follow for evaluation and support.  5. Neuropsych: This patient is capable of making decisions on his own behalf. 6. Skin/Wound Care: Routine pressure relief measures. Educate patient on boosting every 20 minute when in chair.  7. Fluids/Electrolytes/Nutrition: Monitor I/O.   -Pt eating 75-100% meals  -Hyponatremia, 130 on 10/21.   -Hyperkalemia, 5.3 on 10/21.   -Will recheck BMET 8. Anxiety  -Klonipin 0.25 PRN BID started  LOS (Days) 3 A FACE TO FACE EVALUATION WAS PERFORMED  Claudette LawsKIRSTEINS,ANDREW E 03/09/2015 7:12 AM

## 2015-03-09 NOTE — Progress Notes (Signed)
Occupational Therapy Session Note  Patient Details  Name: Craig Hoffman MRN: [id number] Date of Birth: 10/08/1995  Today's Date: 03/09/2015 OT Individual Time: 1015-1122 67 min   Short Term Goals: Week 1:  OT Short Term Goal 1 (Week 1): Pt will perform LB dressing with mod A in order to decrease assist with self care. OT Short Term Goal 2 (Week 1): Pt will perform UB dressing with set up A in order to increase I in self care. OT Short Term Goal 3 (Week 1): Pt will perform toileting with steady assist in order to increase I with self care. OT Short Term Goal 4 (Week 1): Pt will perform toilet transfer onto elevated toilet seat with steady assist in order to increase I with self care.  Skilled Therapeutic Interventions/Progress Updates: ADL-retraining at shower level with focus on improved mobility, transfers (bed, toilet, shower chair, w/c) using RW, seated bathing, toileting, and adapted dressing skills.   Pt requires extra time and occasional redirection to work through discomforts throughout session although with good effort during each task.  Pt bathes with overall min assist, dresses with overall max assist and toilets with mod assist during this session.   Assist required d/t grip/pinch strength weakness and residual pain.   Pt demo's LOB 2 times during mobility with therapist facilitating recovery with steadying assist.    Pt left in w/c at end of session; parents return and are briefly educated on pt's progress during this session.     Therapy Documentation Precautions:  Precautions Precautions: Fall, Cervical Required Braces or Orthoses: Cervical Brace Cervical Brace: Hard collar, At all times Restrictions Weight Bearing Restrictions: No  Pain: Pain Assessment Pain Assessment: 0-10 Pain Score: 10-Worst pain ever Pain Type: Neuropathic pain Pain Location: Leg Pain Orientation: Right;Left Pain Descriptors / Indicators: Pins and needles Pain Frequency: Constant Pain Onset:  On-going Patients Stated Pain Goal: 3 Pain Intervention(s): Repositioned;Distraction;Shower   See Function Navigator for Current Functional Status.   Therapy/Group: Individual Therapy  [name] 03/09/2015, 1:02 PM

## 2015-03-09 NOTE — Progress Notes (Signed)
Patient information reviewed and entered into [facility] by Becky Daymien Goth, covering PPS coordinator. Information including medical coding and functional independence measure will be reviewed and updated through discharge.   

## 2015-03-09 NOTE — Progress Notes (Signed)
Discussed patient's injury, anticipated progress as well as pain issues due to current injury. Family concerned about his bipolar history and relayed that psych consult was called in. Patient has seen Dr. Elsie SaasJonnalagadda in the past.  PATIENT HAS TWO MEDICAL RECORDS-- Genie Logue notified.  Parents report issues with behavior as well as suicidal ideation in the past.  He was put on Abilify but parent's did not want him to be on "strong" medications.  [facility] has [facility] well as [facility]  Records reviewed--patient has history of polysubstance abuse including alcohol, heroin, cocaine and marijuana.  [facility] ide[facility] 11/3- 03/25/13 ED visit 11/2013 due to unintentional drug overdose.

## 2015-03-09 NOTE — Progress Notes (Signed)
Occupational Therapy Session Note  Patient Details  Name: Craig Hoffman MRN: [id number] Date of Birth: 10/30/1995  Today's Date: 03/09/2015 OT Individual Time: [id number] and [id number]  OT Individual Time Calculation (min): 30 min and 65 min   Short Term Goals: Week 1:  OT Short Term Goal 1 (Week 1): Pt will perform LB dressing with mod A in order to decrease assist with self care. OT Short Term Goal 2 (Week 1): Pt will perform UB dressing with set up A in order to increase I in self care. OT Short Term Goal 3 (Week 1): Pt will perform toileting with steady assist in order to increase I with self care. OT Short Term Goal 4 (Week 1): Pt will perform toilet transfer onto elevated toilet seat with steady assist in order to increase I with self care.  Skilled Therapeutic Interventions/Progress Updates:    1) Treatment session with focus on BUE strength and carryover to functional tasks. Assessed grip and pinch strength with grip: Rt: 42# and Lt: 43#, 3 jaw chuck: Rt:3# and Lt: 5#, tip to tip: Rt:2# and Lt:2#.  Provided theraputty and encouraged pt to utilize for grip and pinch strength with pt return demonstrating.  2) Treatment session with focus on functional mobility and participation in self-care tasks.  Pt reports frustration with mother and wanting to see his girlfriend.  Provided emotional support and encouragement with discussion of rehab process and recovery process as well as stages of grief.  Pt's father arrived, bringing new shoes.  Assisted pt with donning slip on sneakers.  Ambulated to bathroom with RW and min assist, with pt requiring cues and stabilization during turn to approach toilet.  Pt declined attempting to doff/don pants with toileting with therapist completing steps for him.  Ambulated 130 feet followed by 147 feet after seated rest break.  Educated on seated as well as standing rest breaks as needed, inspection of feet with no shoes due to decreased sensation, and not over  doing it.  Pt repeatedly stating "I just want to walk".  Pt tearful at end of session with BLE giving out with final ambulation, again provided emotional support and encouragement.  Also re-educated pt to set goals and focus on the positives.  Pt left with father and mother present providing encouragement and support.  Therapy Documentation Precautions:  Precautions Precautions: Fall, Cervical Required Braces or Orthoses: Cervical Brace Cervical Brace: Hard collar, At all times Restrictions Weight Bearing Restrictions: No Pain:   Pt with c/o pain, tingling in all 4 extremities. RN aware  See Function Navigator for Current Functional Status.   Therapy/Group: Individual Therapy  Rosalio LoudHOXIE, Indira Sorenson 03/09/2015, 12:30 PM

## 2015-03-09 NOTE — Progress Notes (Signed)
Physical Therapy Session Note  Patient Details  Name: Craig Hoffman MRN: [id number] Date of Birth: 06/27/1995  Today's Date: 03/09/2015 PT Individual Time: 1500-1530 PT Individual Time Calculation (min): 30 min   Short Term Goals: Week 1:  PT Short Term Goal 1 (Week 1): Patient will be able to perform all bed mobility with Supervision.  PT Short Term Goal 2 (Week 1): Patient will be able to perform transfer supine to sit with Supervision PT Short Term Goal 3 (Week 1): Patient will be able to perform bed to w/c transfers with SBA PT Short Term Goal 4 (Week 1): Patient will be able to participate in gait on a distance of 100 feet with min A with LRAD  Skilled Therapeutic Interventions/Progress Updates:    Pt received seated in w/c on phone with a friend, expressing frustration with mothers restrictions on visitors. States "how am I going to heal when I can't see the people who matter to me". Suggested pt talk with CSW regarding visitors and patient rights. Gait x150' with RW and CGA and close w/c follow for safety due to hx of occasional knee buckling. Sit <>supine in simulated apartment in bed; increased time due to pt hesitancy, mod cues for hand and LE placement and minA overall for transfer in both directions. Gait to return to room x30' before pt requests seated rest break due to ankles "feeling wobbly". After short rest break, performed remaining distance to return to room approximately 120'. While walking past room door, pt angrily rips "no visitor" sign off the door and throws in the trash. Pt seated on EOB; returned to supine with supervision, father assisting with positioning using pillows. Remained supine in bed with all needs in reach and family present at completion of session.   Therapy Documentation Precautions:  Precautions Precautions: Fall, Cervical Required Braces or Orthoses: Cervical Brace Cervical Brace: Hard collar, At all times Restrictions Weight Bearing  Restrictions: No Pain: Pain Assessment Pain Assessment: 0-10 Pain Score: 10-Worst pain ever Pain Type: Surgical pain Pain Location: Neck Pain Orientation: Mid Pain Descriptors / Indicators: Aching;Sore Pain Onset: On-going Patients Stated Pain Goal: 3 Pain Intervention(s): Ambulation/increased activity;Repositioned Multiple Pain Sites: No   See Function Navigator for Current Functional Status.   Therapy/Group: Individual Therapy  Vista Lawmanlizabeth J Tygielski 03/09/2015, 3:42 PM
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
## 2015-03-09 NOTE — Care Management Note (Signed)
Inpatient Rehabilitation Center Individual Statement of Services  Patient Name:  [name]  Date:  03/09/2015  Welcome to the Inpatient Rehabilitation Center.  Our goal is to provide you with an individualized program based on your diagnosis and situation, designed to meet your specific needs.  With this comprehensive rehabilitation program, you will be expected to participate in at least 3 hours of rehabilitation therapies Monday-Friday, with modified therapy programming on the weekends.  Your rehabilitation program will include the following services:  Physical Therapy (PT), Occupational Therapy (OT), 24 hour per day rehabilitation nursing, Therapeutic Recreaction (TR), Psychology, Neuropsychology, Case Management (Social Worker), Rehabilitation Medicine, Nutrition Services and Pharmacy Services  Weekly team conferences will be held on Wednesday to discuss your progress.  Your Social Worker will talk with you frequently to get your input and to update you on team discussions.  Team conferences with you and your family in attendance may also be held.  Expected length of stay: 12-14 days  Overall anticipated outcome: supervision/mod/i level  Depending on your progress and recovery, your program may change. Your Social Worker will coordinate services and will keep you informed of any changes. Your Social Worker's name and contact numbers are listed  below.  The following services may also be recommended but are not provided by the Inpatient Rehabilitation Center:   Driving Evaluations  Home Health Rehabiltiation Services  Outpatient Rehabilitation Services  Vocational Rehabilitation   Arrangements will be made to provide these services after discharge if needed.  Arrangements include referral to agencies that provide these services.  Your insurance has been verified to be:  None-applying for Medicaid Your primary doctor is:  None  Pertinent information will be shared with your  doctor and your insurance company.  Social Worker:  Dossie DerBecky Rhonna Holster, SW [phone] or [phone]) [phone]  Information discussed with and copy given to patient by: Lucy Chrisupree, [place] Mielke G, 03/09/2015, 2:01 PM

## 2015-03-10 ENCOUNTER — Inpatient Hospital Stay (HOSPITAL_COMMUNITY): Payer: Medicaid Other | Admitting: *Deleted

## 2015-03-10 ENCOUNTER — Inpatient Hospital Stay (HOSPITAL_COMMUNITY): Payer: Medicaid Other

## 2015-03-10 DIAGNOSIS — F332 Major depressive disorder, recurrent severe without psychotic features: Secondary | ICD-10-CM

## 2015-03-10 MED ORDER — COLLAGENASE 250 UNIT/GM EX OINT
TOPICAL_OINTMENT | Freq: Every day | CUTANEOUS | Status: DC
  Administered 2015-03-11 – 2015-03-17 (×7): via TOPICAL
  Filled 2015-03-10 (×2): qty 30

## 2015-03-10 MED ORDER — PREGABALIN 100 MG PO CAPS
100.0000 mg | ORAL_CAPSULE | Freq: Two times a day (BID) | ORAL | Status: DC
  Administered 2015-03-10 (×2): 100 mg via ORAL
  Filled 2015-03-10 (×2): qty 1
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
## 2015-03-10 NOTE — Progress Notes (Signed)
Occupational Therapy Session Note  Patient Details  Name: Craig Hoffman MRN: [id number] Date of Birth: 03/11/1996  Today's Date: 03/10/2015 OT Individual Time: 1000-1100 OT Individual Time Calculation (min): 60 min    Short Term Goals: Week 1:  OT Short Term Goal 1 (Week 1): Pt will perform LB dressing with mod A in order to decrease assist with self care. OT Short Term Goal 2 (Week 1): Pt will perform UB dressing with set up A in order to increase I in self care. OT Short Term Goal 3 (Week 1): Pt will perform toileting with steady assist in order to increase I with self care. OT Short Term Goal 4 (Week 1): Pt will perform toilet transfer onto elevated toilet seat with steady assist in order to increase I with self care.  Skilled Therapeutic Interventions/Progress Updates: ADL-retraining at shower level with focus on sit<>stands, functional mobility using RW, transfers, BUE coordination/FMC, and adapted bathing/dressing skills.   Pt received seated in w/c awaiting therapist with mother present.   Pt challenged to walk to dresser, gather clothing and proceed with shower level bathing (new pads present).   Pt completes tasks with steadying assist during mobility and setup assist to carry clothes to bathoom.   Pt transfers to toilet, toilets (voiding only urine) and progresses to shower chair to bathe.   Pt bathes, washing both lower legs this session with intermittent supervision and min assist to wash buttocks and back.   Pt dresses in bathroom seated on BSC with setup and min assist when standing and to pull up his pants.   Pt adjusts pants to his preference unassisted while standing in RW.   Pt is escorted back to bed to lie supine as therapist removes wet pads to replace on cervical collar.    RN and PA alerted to inspect wound at neck and pt remains in bed as staff inspect and treat wound through end of session.   Pt's mother educated on replacement of pad; RN alerted to need to assist.     Therapy Documentation Precautions:  Precautions Precautions: Fall, Cervical Required Braces or Orthoses: Cervical Brace Cervical Brace: Hard collar, At all times Restrictions Weight Bearing Restrictions: No  Pain: Pain Assessment Pain Score: Asleep  See Function Navigator for Current Functional Status.   Therapy/Group: Individual Therapy  [name] 03/10/2015, 3:43 PM

## 2015-03-10 NOTE — Progress Notes (Signed)
Late entry: Spoke to pt's mom regarding her conversation with pt that had escalated. Mom apologetic regarding the interaction. Assisted mom to problem solved in regards to how situation can be avoided, and different ways to approach pt in the future. Mom appreciative, and agreeable with plan.

## 2015-03-10 NOTE — Progress Notes (Signed)
[place] PHYSICAL MEDICINE & REHABILITATION     PROGRESS NOTE    Subjective/Complaints:  Appreciate psych note Sleeping this am, difficult to awaken ROS: + shooting pain in bilateral upper extremities.   Objective: Vital Signs: Blood pressure 164/91, pulse 75, temperature 97.9 F (36.6 C), temperature source Oral, resp. rate 20, height 6' (1.829 m), weight 74.662 kg (164 lb 9.6 oz), SpO2 100 %. No results found.  Recent Labs  03/09/15 0407  WBC 8.7  HGB 15.7  HCT 45.7  PLT 215    Recent Labs  03/09/15 0407  NA 131*  K 4.2  CL 91*  GLUCOSE 115*  BUN 12  CREATININE 0.87  CALCIUM 9.9   CBG (last 3)  No results for input(s): GLUCAP in the last 72 hours.  Wt Readings from Last 3 Encounters:  03/06/15 74.662 kg (164 lb 9.6 oz) (66 %*, Z = 0.42)  03/01/15 72 kg (158 lb 11.7 oz) (58 %*, Z = 0.21)   * Growth percentiles are based on CDC 2-20 Years data.    Physical Exam:  BP 164/91 mmHg  Pulse 75  Temp(Src) 97.9 F (36.6 C) (Oral)  Resp 20  Ht 6' (1.829 m)  Wt 74.662 kg (164 lb 9.6 oz)  BMI 22.32 kg/m2  SpO2 100% Constitutional: He is oriented to person, place, and time. He appears well-developed and well-nourished. Vitals reviewed.   Gen NAD  Neurological: He is alert and oriented.   Unable to assess reflexes due to sensitivity   Psychiatric: His speech is normal. Cognition and memory are normal. Affect brighter   Assessment/Plan: 1. 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 Physiatrist is providing close team supervision and 24 hour management of active medical problems listed below. Physiatrist and rehab team continue to assess barriers to discharge/monitor patient progress toward functional and medical goals.  Function:  Bathing Bathing  position Bathing activity did not occur: Refused Position: Systems developer parts bathed by patient: Right arm, Left arm, Chest, Abdomen, Front perineal area, Right upper leg, Left upper leg Body parts bathed by helper: Buttocks  Bathing assist Assist Level: Touching or steadying assistance(Pt > 75%)      Upper Body Dressing/Undressing Upper body dressing   What is the patient wearing?: Pull over shirt/dress     Pull over shirt/dress - Perfomed by patient: Thread/unthread right sleeve, Thread/unthread left sleeve Pull over shirt/dress - Perfomed by helper: Put head through opening, Pull shirt over trunk        Upper body assist Assist Level: Touching or steadying assistance(Pt > 75%)      Lower Body Dressing/Undressing Lower body dressing   What is the patient wearing?: Underwear, Pants, Non-skid slipper socks   Underwear - Performed by helper: Thread/unthread right underwear leg, Thread/unthread left underwear leg, Pull underwear up/down Pants- Performed by patient: Thread/unthread right pants leg, Thread/unthread left pants leg, Pull pants up/down, Fasten/unfasten pants Pants- Performed by helper: Thread/unthread right pants leg, Thread/unthread left pants leg, Pull pants up/down   Non-skid slipper socks- Performed by helper: Don/doff right sock, Don/doff left sock                  Lower body assist Assist for lower body dressing: Touching or steadying assistance (Pt > 75%)      Toileting Toileting Toileting activity did not  occur: No continent bowel/bladder event (using urinal) Toileting steps completed by patient: Performs perineal hygiene Toileting steps completed by helper: Adjust clothing prior to toileting, Adjust clothing after toileting    Toileting assist Assist level: Touching or steadying assistance (Pt.75%)   Transfers Chair/bed transfer   Chair/bed transfer method: Stand pivot Chair/bed transfer assist level: Touching or steadying assistance  (Pt > 75%) Chair/bed transfer assistive device: Patent attorneyWalker     Locomotion Ambulation     Max distance: 150 Assist level: Touching or steadying assistance (Pt > 75%)   Wheelchair Wheelchair activity did not occur: Safety/medical concerns Type: Manual      Cognition Comprehension Comprehension assist level: Understands basic 90% of the time/cues < 10% of the time  Expression Expression assist level: Expresses basic 90% of the time/requires cueing < 10% of the time.  Social Interaction Social Interaction assist level: Interacts appropriately 50 - 74% of the time - May be physically or verbally inappropriate.  Problem Solving Problem solving assist level: Solves basic 75 - 89% of the time/requires cueing 10 - 24% of the time  Memory Memory assist level: Recognizes or recalls 75 - 89% of the time/requires cueing 10 - 24% of the time    Medical Problem List and Plan: 1. 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 DVT Prophylaxis/Anticoagulation: Mechanical: Sequential compression devices, below knee Bilateral lower extremities 3. Pain Management: Ultram scheduled. Continue oxycodone prn for now. Lyrica increased to 150 mg bid 10/23. psych added gabapentin and cymbalta which should help with pain as well so will start to wean Lyrica 4. Mood: LCSW to follow for evaluation and support.  5. Neuropsych: This patient is capable of making decisions on his own behalf. 6. Skin/Wound Care: Routine pressure relief measures. Educate patient on boosting every 20 minute when in chair.  7. Fluids/Electrolytes/Nutrition: Monitor I/O.   -Pt eating 75-100% meals  -Hyponatremia, 130 on 10/21.   -Hyperkalemia, 5.3 on 10/21.   - recheck BMET showed Na 131, K normalized at 4.2 8. Anxiety  -Klonipin 0.25 PRN BID started  LOS (Days) 4 A FACE TO FACE EVALUATION WAS PERFORMED  Erick ColaceKIRSTEINS,Sahara Fujimoto  E 03/10/2015 6:52 AM

## 2015-03-10 NOTE — Progress Notes (Signed)
Physical Therapy Session Note  Patient Details  Name: Craig Hoffman MRN: [id number] Date of Birth: [birth year]  Today's Date: 03/10/2015 PT Individual Time:Session 1: 1191-4782; Session 2:1300-1500 PT Individual Time Calculation (min): Session1: 75 min; Session 2: 60 min   Short Term Goals: Week 1:  PT Short Term Goal 1 (Week 1): Patient will be able to perform all bed mobility with Supervision.  PT Short Term Goal 2 (Week 1): Patient will be able to perform transfer supine to sit with Supervision PT Short Term Goal 3 (Week 1): Patient will be able to perform bed to w/c transfers with SBA PT Short Term Goal 4 (Week 1): Patient will be able to participate in gait on a distance of 100 feet with min A with LRAD  Skilled Therapeutic Interventions/Progress Updates:    Session1: Patient in supine with HOB elevated supine to sit supervision.  Patient sit to stand min assist cues for hand placement.  Gait to bathroom with walker min A.  Toilet transfer min assist.  Patient don/doff pants supervision.  Gait x 150' RW minguard A noted ant pelvic tilt, increased adductor use, decreased knee and hip flexion and ankle DF throughout gait.  Patient seated on mat for sit to stand min to no UE support min assist x 5.  Standing hip flexion alternating with UE support x 3.  Sit to supine minguard A.  Bridging x 5 x 2 sets, clamshells hooklying w/ orange t-band x 2 x 10, LE PNF D2 Rhythmic initiation transition to slow reversals with resistance at ankle and hip.  Supine to sit supervision mild pain in neck due to strain sitting straight up.  Patient ambulated x 150' w/ RW minguard cues for upright posture, heel strike, stance stability and hip extension.  Negotiated 4 6" stairs with railings step through technique and minguard to min assist.  Patient transferred to recliner from wheelchair squat pivot minguard supervision assist and left with mother in the room and all needs in reach.  Session2: Patient supine in  bed, supine to sit supervision and transferred to stand with min A to ambulate to bathroom with walker and min A.  Patient standing for donning pant min assist for balance.  Patient ambulated with RW to therapy gym min assist cues and facilitation for hip extension, heel strike, step length, and hip stability to limit crossing over.  Patient seated on therapy mat for core strengthening with red weighted ball, supine with LE on red physioball for trunk rotation short and long and lower trunk flexion and bridging.  Patient supine to sit supervision and performed standing balance static with feet apart eyes closed with cues for remembered target, feet partial tandem eyes closed and feet together eyes closed.  Dynamic  Balance reaching and tossing horseshoes without UE support and minguard assist.  Gait with ski poles x 30' min/mod facilitation for hip/core stability and cues for technique/sequence.  Gait x 100' RW min A before pt fatigued.  Assisted to bed in supine supervision and left with all needs in reach.   Progressing towards goals and motivated though slow to initiate and relating his emotional needs/concerns initially in first session which extends rest breaks, but noted improved transitions after mother arrived.  Patient seems intent on returning to girlfriend's home at d/c.  Therapy Documentation Precautions:  Precautions Precautions: Fall, Cervical Required Braces or Orthoses: Cervical Brace Cervical Brace: Hard collar, At all times Restrictions Weight Bearing Restrictions: No Pain: Pain Assessment Pain Score: 7  Pain Type: Acute  pain Pain Location: Neck Pain Intervention(s): Repositioned;Ambulation/increased activity   See Function Navigator for Current Functional Status.   Therapy/Group: Individual Therapy  Craig Hoffman,Craig Hoffman  Craig Hoffman, [place]PT [phone] 03/10/2015  03/10/2015, 9:29 AM

## 2015-03-11 ENCOUNTER — Inpatient Hospital Stay (HOSPITAL_COMMUNITY): Payer: Medicaid Other | Admitting: Physical Therapy

## 2015-03-11 ENCOUNTER — Inpatient Hospital Stay (HOSPITAL_COMMUNITY): Payer: Medicaid Other

## 2015-03-11 ENCOUNTER — Inpatient Hospital Stay (HOSPITAL_COMMUNITY): Payer: Self-pay | Admitting: Occupational Therapy

## 2015-03-11 MED ORDER — TIZANIDINE HCL 2 MG PO TABS
2.0000 mg | ORAL_TABLET | Freq: Three times a day (TID) | ORAL | Status: DC | PRN
  Administered 2015-03-13 – 2015-03-18 (×5): 2 mg via ORAL
  Filled 2015-03-11 (×6): qty 1

## 2015-03-11 MED ORDER — DULOXETINE HCL 20 MG PO CPEP
40.0000 mg | ORAL_CAPSULE | Freq: Every day | ORAL | Status: DC
  Administered 2015-03-12 – 2015-03-18 (×7): 40 mg via ORAL
  Filled 2015-03-11 (×8): qty 2

## 2015-03-11 MED ORDER — TRAZODONE HCL 50 MG PO TABS: 50.0000 mg | ORAL_TABLET | Freq: Every evening | ORAL | Status: DC | PRN

## 2015-03-11 MED ORDER — GABAPENTIN 100 MG PO CAPS
200.0000 mg | ORAL_CAPSULE | Freq: Three times a day (TID) | ORAL | Status: DC
  Administered 2015-03-11 – 2015-03-14 (×11): 200 mg via ORAL
  Filled 2015-03-11 (×11): qty 2

## 2015-03-11 MED ORDER — OXYCODONE HCL 5 MG PO TABS
10.0000 mg | ORAL_TABLET | ORAL | Status: DC | PRN
  Administered 2015-03-11 (×2): 10 mg via ORAL
  Administered 2015-03-12: 20 mg via ORAL
  Filled 2015-03-11 (×2): qty 2
  Filled 2015-03-11: qty 4

## 2015-03-11 MED ORDER — GABAPENTIN 100 MG PO CAPS
100.0000 mg | ORAL_CAPSULE | Freq: Three times a day (TID) | ORAL | Status: DC
  Administered 2015-03-11: 100 mg via ORAL
  Filled 2015-03-11: qty 1

## 2015-03-11 MED ORDER — PREGABALIN 75 MG PO CAPS
75.0000 mg | ORAL_CAPSULE | Freq: Two times a day (BID) | ORAL | Status: DC
  Administered 2015-03-11 (×2): 75 mg via ORAL
  Filled 2015-03-11 (×2): qty 1

## 2015-03-11 NOTE — Progress Notes (Signed)
Physical Therapy Session Note  Patient Details  Name: Craig Hoffman MRN: [id number] Date of Birth: 01/21/1996  Today's Date: 03/11/2015 PT Individual Time: 1300-1400 PT Individual Time Calculation (min): 60 min   Short Term Goals: Week 1:  PT Short Term Goal 1 (Week 1): Patient will be able to perform all bed mobility with Supervision.  PT Short Term Goal 2 (Week 1): Patient will be able to perform transfer supine to sit with Supervision PT Short Term Goal 3 (Week 1): Patient will be able to perform bed to w/c transfers with SBA PT Short Term Goal 4 (Week 1): Patient will be able to participate in gait on a distance of 100 feet with min A with LRAD  Skilled Therapeutic Interventions/Progress Updates:    Patient in supine starting session.  Assisted to bathroom min assist via walker.  Standing balance minguard to close S during clothing manipulation.  Patient ambulated 120', 50' and 120' min assist with RW and facilitation of post pelvic tilt, abdominal activation, pelvic rotation and cues for step length due to scissoring.  Patient standing at wall rail in hallway performed side stepping with min assist, ant/post rocking with UE support for limits of stability and hip mobility in standing as well as forward lunges with mod assist.  Patient in tall kneeling on mat rolling green p.ball forward and back mod facilitation for upright and stable trunk.  Patient tall kneeling holding ball at arms length and moving laterally for continued core strength.  Patient assisted to bathroom end of session and left with mother in room pt instructed to call for assist when finished.   Therapy Documentation Precautions:  Precautions Precautions: Fall, Cervical Required Braces or Orthoses: Cervical Brace Cervical Brace: Hard collar, At all times Restrictions Weight Bearing Restrictions: No    Vital Signs: Pain: Pain Assessment Pain Assessment: 0-10 Pain Score: 6  Pain Type: Acute pain Pain Location:  Neck Pain Orientation: Left Pain Radiating Towards: shoulder Pain Descriptors / Indicators: Aching Pain Frequency: Constant Pain Onset: With Activity Patients Stated Pain Goal: 4 Pain Intervention(s): Splinting;Repositioned;Ambulation/increased activity   See Function Navigator for Current Functional Status.   Therapy/Group: Individual Therapy  Rudi CocoWYNN,CYNDI  Cyndi MonticelloWynn, [place]PT [phone] 03/11/2015  03/11/2015, 4:30 PM

## 2015-03-11 NOTE — Progress Notes (Addendum)
[place] PHYSICAL MEDICINE & REHABILITATION     PROGRESS NOTE    Subjective/Complaints:  Can I be checked for STDs here, usually has clinic visit every January or Feb ROS: + shooting pain in bilateral upper extremities.   Objective: Vital Signs: Blood pressure 146/65, pulse 75, temperature 98.3 F (36.8 C), temperature source Oral, resp. rate 18, height 6' (1.829 m), weight 74.662 kg (164 lb 9.6 oz), SpO2 100 %. No results found.  Recent Labs  03/09/15 0407  WBC 8.7  HGB 15.7  HCT 45.7  PLT 215    Recent Labs  03/09/15 0407  NA 131*  K 4.2  CL 91*  GLUCOSE 115*  BUN 12  CREATININE 0.87  CALCIUM 9.9   CBG (last 3)  No results for input(s): GLUCAP in the last 72 hours.  Wt Readings from Last 3 Encounters:  03/06/15 74.662 kg (164 lb 9.6 oz) (66 %*, Z = 0.42)  03/01/15 72 kg (158 lb 11.7 oz) (58 %*, Z = 0.21)   * Growth percentiles are based on CDC 2-20 Years data.    Physical Exam:  BP 146/65 mmHg  Pulse 75  Temp(Src) 98.3 F (36.8 C) (Oral)  Resp 18  Ht 6' (1.829 m)  Wt 74.662 kg (164 lb 9.6 oz)  BMI 22.32 kg/m2  SpO2 100% Motor 4/5 in BUE and BLE No allodynia, sensory intact to LT Cramping of toes, flexion, able to range passively without pain   Assessment/Plan: 1. 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 Physiatrist is providing close team supervision and 24 hour management of active medical problems listed below. Physiatrist and rehab team continue to assess barriers to discharge/monitor patient progress toward functional and medical goals.  Function:  Bathing Bathing position Bathing activity did not occur: Refused Position: Systems developer parts bathed by patient: Right arm, Left arm, Chest, Abdomen, Front perineal area, Right  upper leg, Left upper leg, Right lower leg, Left lower leg Body parts bathed by helper: Buttocks  Bathing assist Assist Level: Touching or steadying assistance(Pt > 75%)      Upper Body Dressing/Undressing Upper body dressing   What is the patient wearing?: Pull over shirt/dress     Pull over shirt/dress - Perfomed by patient: Thread/unthread right sleeve, Thread/unthread left sleeve Pull over shirt/dress - Perfomed by helper: Put head through opening, Pull shirt over trunk        Upper body assist Assist Level: Touching or steadying assistance(Pt > 75%)      Lower Body Dressing/Undressing Lower body dressing   What is the patient wearing?: Underwear, Pants, Non-skid slipper socks Underwear - Performed by patient: Thread/unthread right underwear leg, Thread/unthread left underwear leg, Pull underwear up/down Underwear - Performed by helper: Thread/unthread right underwear leg, Thread/unthread left underwear leg, Pull underwear up/down Pants- Performed by patient: Thread/unthread right pants leg, Thread/unthread left pants leg, Pull pants up/down, Fasten/unfasten pants Pants- Performed by helper: Thread/unthread right pants leg, Thread/unthread left pants leg, Pull pants up/down Non-skid slipper socks- Performed by patient: Don/doff right sock, Don/doff left sock Non-skid slipper socks- Performed by helper: Don/doff right sock, Don/doff left sock                  Lower body assist Assist for lower body dressing: Set up      Financial trader  activity did not occur: No continent bowel/bladder event (using urinal) Toileting steps completed by patient: Adjust clothing prior to toileting, Performs perineal hygiene, Adjust clothing after toileting Toileting steps completed by helper: Performs perineal hygiene    Toileting assist Assist level: Touching or steadying assistance (Pt.75%)   Transfers Chair/bed transfer   Chair/bed transfer method: Stand  pivot Chair/bed transfer assist level: Touching or steadying assistance (Pt > 75%) Chair/bed transfer assistive device: Patent attorneyWalker     Locomotion Ambulation     Max distance: 150 Assist level: Touching or steadying assistance (Pt > 75%)   Wheelchair Wheelchair activity did not occur: Safety/medical concerns Type: Manual      Cognition Comprehension Comprehension assist level: Understands basic 90% of the time/cues < 10% of the time  Expression Expression assist level: Expresses basic 90% of the time/requires cueing < 10% of the time.  Social Interaction Social Interaction assist level: Interacts appropriately 50 - 74% of the time - May be physically or verbally inappropriate.  Problem Solving Problem solving assist level: Solves basic 75 - 89% of the time/requires cueing 10 - 24% of the time  Memory Memory assist level: Recognizes or recalls 75 - 89% of the time/requires cueing 10 - 24% of the time    Medical Problem List and Plan: 1. 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 DVT Prophylaxis/Anticoagulation: Mechanical: Sequential compression devices, below knee Bilateral lower extremities 3. Pain Management: Ultram scheduled. Continue oxycodone prn for now. Lyrica 100mg  BID psych added gabapentin and cymbalta which should help with pain as well so will start to wean Lyrica, increase gabapentin to TID 4. Mood: LCSW to follow for evaluation and support.  5. Neuropsych: This patient is capable of making decisions on his own behalf. 6. Skin/Wound Care: Routine pressure relief measures. Educate patient on boosting every 20 minute when in chair.  7. Fluids/Electrolytes/Nutrition: Monitor I/O.    8. Anxiety and Major Depression  -appreciate Psychiatry consult 9.  Hx of drug overdose, intentional, wean narcotics LOS (Days) 5 A FACE TO FACE  EVALUATION WAS PERFORMED  Erick ColaceKIRSTEINS,ANDREW E 03/11/2015 7:31 AM
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
## 2015-03-11 NOTE — Plan of Care (Signed)
Problem: RH SKIN INTEGRITY Goal: RH STG ABLE TO PERFORM INCISION/WOUND CARE W/ASSISTANCE STG Able To Perform Incision/Wound Care With BJ'sMin Assistance.  Outcome: Not Progressing Total assist      Problem: RH PAIN MANAGEMENT Goal: RH STG PAIN MANAGED AT OR BELOW PT'S PAIN GOAL 4 or less on a scale of 0-10.  Outcome: Not Progressing Rates pain a 6 or higher
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
## 2015-03-11 NOTE — Progress Notes (Signed)
Occupational Therapy Session Note  Patient Details  Name: Craig Hoffman MRN: [id number] Date of Birth: 03/22/1996  Today's Date: 03/11/2015 OT Individual Time: 1100-1200 OT Individual Time Calculation (min): 60 min    Short Term Goals: Week 1:  OT Short Term Goal 1 (Week 1): Pt will perform LB dressing with mod A in order to decrease assist with self care. OT Short Term Goal 2 (Week 1): Pt will perform UB dressing with set up A in order to increase I in self care. OT Short Term Goal 3 (Week 1): Pt will perform toileting with steady assist in order to increase I with self care. OT Short Term Goal 4 (Week 1): Pt will perform toilet transfer onto elevated toilet seat with steady assist in order to increase I with self care.  Skilled Therapeutic Interventions/Progress Updates:    1:1 self care retraining at shower level with focus on safe bed mobility, functional ambulation with RW around room to obtain items and into bathroom.  Pt continues to demonstrate flexion of left toe during stance and ambulation. Pt able to straighten when attending to toe but in resting position or when not paying attention to it remains in flexion.  Pt able to wash all parts with steadying A sit to stands.  Pt with notable loose brace today - provided education to pt and pt's mom about importance of proper fitting and how to adjust for best fit.  Also provided education about how to change pads and care for the pads (wash, dry instructions).  Pt's RN in room during changing of pads for wound bandage changing.  Pt still with c/o shoulder discomfort but able to work through but required long rest break after bandage change due to pain.   Therapy Documentation Precautions:  Precautions Precautions: Fall, Cervical Required Braces or Orthoses: Cervical Brace Cervical Brace: Hard collar, At all times Restrictions Weight Bearing Restrictions: No Pain: Pain Assessment Pain Assessment: 0-10 Pain Score: 10-Worst pain  ever Pain Type: Acute pain Pain Location: Neck Pain Orientation: Left Pain Radiating Towards: shoulder Pain Descriptors / Indicators: Aching;Sore Pain Frequency: Constant Pain Onset: On-going Patients Stated Pain Goal: 4 Pain Intervention(s): Medication (See eMAR)- after cleaning and packing wound  See Function Navigator for Current Functional Status.   Therapy/Group: Individual Therapy  Roney MansSmith, Craig Hoffman 03/11/2015, 4:14 PM
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
## 2015-03-11 NOTE — Progress Notes (Signed)
Physical Therapy Session Note  Patient Details  Name: Craig Hoffman MRN: [id number] Date of Birth: 04/30/1996  Today's Date: 03/11/2015 PT Individual Time: 0830-0930 Treatment Session 2: 1440-1500 PT Individual Time Calculation (min): 60 min Treatment Session 2: 20 min   Short Term Goals: Week 1:  PT Short Term Goal 1 (Week 1): Patient will be able to perform all bed mobility with Supervision.  PT Short Term Goal 2 (Week 1): Patient will be able to perform transfer supine to sit with Supervision PT Short Term Goal 3 (Week 1): Patient will be able to perform bed to w/c transfers with SBA PT Short Term Goal 4 (Week 1): Patient will be able to participate in gait on a distance of 100 feet with min A with LRAD  Skilled Therapeutic Interventions/Progress Updates:    Treatment Session 1: Pt received in bed - initially, demonstrating poor eye contact and not responding to PT's questions. Mom entered shortly after PT session began and strained relationship between the patient and mom noted. Pt began to participate more, with more time with this PT. Therapeutic Activity - see function tab for details. Pt completes supine to sit bed mobility through long sit; PT educates pt that it may be less strain on his neck if he goes through side lie, but pt refuses to trial this. Car transfer - set up at medium-low seat height, PT educates pt and mom about seat modifications for increased ease and safety. Gait Training - See function tab for details - verbal cues for heel strike. Pt demonstrates good awareness to stop and rest with legs demonstrate clonus, likely from fatigue, during ambulation. Stair training - 1 + 2 + 3 + 4 steps with B rails req up to min A for safety: step over step on ascent, step over step descent backwards, but step-to descent forward (only when doing full 4 steps). Pt ended in bed, to rest with all needs in reach.  Treatment Session 2: Pt received in bed, difficult to arouse, but  eventually opening eyes. Pt wishes to do bed level exercises and PT agrees to this as long as pt keeps eyes open and participates. Pt does not keep eyes open, but does participate throughout session. Therapeutic Exercise - PT instructs pt in B LE AROM and strengthening exercises: SLR, mod resisted hip/knee extension - manual resistance from PT, side lie hip abduction, and hip IR in B hook lie: 2 x 10 reps each. Pt ended in bed, comfortable, with all needs in reach. PT's LE muscle activation grossly improved as exercises went on, presumed due to increased pt effort. Continue per PT POC.     Therapy Documentation Precautions:  Precautions Precautions: Fall, Cervical Required Braces or Orthoses: Cervical Brace Cervical Brace: Hard collar, At all times Restrictions Weight Bearing Restrictions: No Pain: Pain Assessment Pain Assessment: 0-10 Pain Score: 10-Worst pain ever Pain Type: Acute pain Pain Location: Neck Pain Orientation: Left Pain Descriptors / Indicators: Aching;Sore Pain Frequency: Constant Pain Onset: On-going Patients Stated Pain Goal: 3 Pain Intervention(s): Medication (See eMAR) Multiple Pain Sites: Yes 2nd Pain Site Pain Score: 10 Pain Type: Neuropathic pain Pain Location: Leg Pain Orientation: Left;Right Pain Descriptors / Indicators: Numbness Pain Onset: On-going Pain Intervention(s): Medication (See eMAR) Treatment Session 2: Pt reports 10/10 pain in B legs - PT uses rest breaks to decrease pt's pain.   See Function Navigator for Current Functional Status.   Therapy/Group: Individual Therapy  Ashraf Mesta M 03/11/2015, 9:08 AM

## 2015-03-12 ENCOUNTER — Inpatient Hospital Stay (HOSPITAL_COMMUNITY): Payer: Medicaid Other | Admitting: Physical Therapy

## 2015-03-12 ENCOUNTER — Inpatient Hospital Stay (HOSPITAL_COMMUNITY): Payer: Self-pay

## 2015-03-12 ENCOUNTER — Encounter (HOSPITAL_COMMUNITY): Payer: Self-pay

## 2015-03-12 ENCOUNTER — Inpatient Hospital Stay (HOSPITAL_COMMUNITY): Payer: Medicaid Other

## 2015-03-12 MED ORDER — OXYCODONE HCL 5 MG PO TABS
10.0000 mg | ORAL_TABLET | ORAL | Status: DC | PRN
  Administered 2015-03-12: 15 mg via ORAL
  Administered 2015-03-12: 10 mg via ORAL
  Filled 2015-03-12: qty 3
  Filled 2015-03-12: qty 2
  Filled 2015-03-12: qty 3

## 2015-03-12 MED ORDER — PREGABALIN 50 MG PO CAPS
50.0000 mg | ORAL_CAPSULE | Freq: Two times a day (BID) | ORAL | Status: DC
  Administered 2015-03-12 (×2): 50 mg via ORAL
  Filled 2015-03-12 (×2): qty 1

## 2015-03-12 NOTE — Progress Notes (Signed)
Physical Therapy Session Note  Patient Details  Name: Craig Hoffman MRN: [id number] Date of Birth: 01/17/1996  Today's Date: 03/12/2015 PT Individual Time: 1100-1200 PT Individual Time Calculation (min): 60 min   Short Term Goals: Week 1:  PT Short Term Goal 1 (Week 1): Patient will be able to perform all bed mobility with Supervision.  PT Short Term Goal 2 (Week 1): Patient will be able to perform transfer supine to sit with Supervision PT Short Term Goal 3 (Week 1): Patient will be able to perform bed to w/c transfers with SBA PT Short Term Goal 4 (Week 1): Patient will be able to participate in gait on a distance of 100 feet with min A with LRAD  Skilled Therapeutic Interventions/Progress Updates:    Pt received in long sit in bed, immediately after neuropsych meeting. Therapeutic Activity - See function tab for bed mobility and transfer details - grossly SBA. Gait Training - see function tab for details (210' with RW and 4780' with B ski poles). Focus on 4 point gait pattern and noting limits of stability with ski poles. With RW, focus is on heel strike and reduce compensatory trunk leans. Gait with RW done 3 sets of this distance - third set, focus is on increasing gait speed and pt's gait pattern becomes more normalized - more even step length and base of support more consistent width. Pt ended up in recliner with lunch tray in front, mom present, and all needs in reach. Pt may progress to the point where he does not need a w/c at d/c. Continue per PT POC.    Therapy Documentation Precautions:  Precautions Precautions: Fall, Cervical Required Braces or Orthoses: Cervical Brace Cervical Brace: Hard collar, At all times Restrictions Weight Bearing Restrictions: No Pain: Pain Assessment Pain Assessment: 0-10 Pain Score: 8  Pain Type: Acute pain Pain Location: Neck Pain Orientation: Right;Left Pain Descriptors / Indicators: Aching;Sore Pain Onset: On-going Pain Intervention(s):  Rest Multiple Pain Sites: No  See Function Navigator for Current Functional Status.   Therapy/Group: Individual Therapy  Inaya Gillham M 03/12/2015, 11:05 AM

## 2015-03-12 NOTE — Progress Notes (Signed)
Pt slept very little tonight. Was on the phone a majority of the night. Pt asked that I did not wake him up to offer pain medicine and assured me he would call and ask when he needed it. Pt fell asleep around 0230-0300, and slept off and on. No visitors present during my shift besides mom and sister at shift change.

## 2015-03-12 NOTE — Progress Notes (Signed)
Physical Therapy Session Note  Patient Details  Name: Craig Hoffman MRN: [id number] Date of Birth: 07/01/1995  Today's Date: 03/12/2015 PT Individual Time: 1300-1359 PT Individual Time Calculation (min): 59 min   Short Term Goals: Week 1:  PT Short Term Goal 1 (Week 1): Patient will be able to perform all bed mobility with Supervision.  PT Short Term Goal 2 (Week 1): Patient will be able to perform transfer supine to sit with Supervision PT Short Term Goal 3 (Week 1): Patient will be able to perform bed to w/c transfers with SBA PT Short Term Goal 4 (Week 1): Patient will be able to participate in gait on a distance of 100 feet with min A with LRAD  Skilled Therapeutic Interventions/Progress Updates:   Pt received sitting in recliner and agreeable to therapy session. Session focused on gait training with RW, endurance, and dynamic standing balance on firm and compliant surfaces.  Pt requesting to toilet before going to therapy gym, see function tab for details.  PT instructed patient in gait with RW x150' at start of session +150' at end of session with supervision with verbal cues for step length, knee control, and upright posture.  PT instructed patient in standing balance activity shooting basketball on firm surface and on foam surface.  Pt's walker positioned in front for safety, but patient able to maintain standing balance x20 minutes without UE support while shooting ball, bending knees, rotating trunk, and weight shifting R-L.   PT instructed patient in Wii bowling on compliant surface with RW positioned to L side for improved use of RUE for bowling.  Pt required intermitted LUE support on RW for balance.  Pt performed 5 min on Nustep with LEs only at level 2 for increased endurance and LE strengthening.  Pt returned to room at end of session and positioned sitting EOB with family present, call bell in reach and needs met.   Therapy Documentation Precautions:  Precautions Precautions:  Fall, Cervical Required Braces or Orthoses: Cervical Brace Cervical Brace: Hard collar, At all times Restrictions Weight Bearing Restrictions: No Pain: Pain Assessment Pain Assessment: 0-10 Pain Score: 8  Pain Type: Acute pain Pain Location: Neck Pain Orientation: Right;Left Pain Descriptors / Indicators: Aching;Sore Pain Onset: On-going Pain Intervention(s): Rest Multiple Pain Sites: No   See Function Navigator for Current Functional Status.   Therapy/Group: Individual Therapy   E Penven-Crew 03/12/2015, 2:30 PM  

## 2015-03-12 NOTE — Progress Notes (Signed)
Occupational Therapy Session Note  Patient Details  Name: Craig Hoffman MRN: [id number] Date of Birth: 07/25/1995  Today's Date: 03/12/2015 OT Individual Time:0830-0930 60 Min   Short Term Goals: Week 1:  OT Short Term Goal 1 (Week 1): Pt will perform LB dressing with mod A in order to decrease assist with self care. OT Short Term Goal 2 (Week 1): Pt will perform UB dressing with set up A in order to increase I in self care. OT Short Term Goal 3 (Week 1): Pt will perform toileting with steady assist in order to increase I with self care. OT Short Term Goal 4 (Week 1): Pt will perform toilet transfer onto elevated toilet seat with steady assist in order to increase I with self care.  Skilled Therapeutic Interventions/Progress Updates: ADL-retraining with focus on family education with pt's mother on assistance with performance of BADL.   Pt received seated in w/c awaiting therapist and receptive for bathing; pt's mother arrives while pt is gathering his clothing with supervision.   OT educates mother on providing contact guard during mobility and transfers and on allowing pt to initiate a task independently before attempting intervention assist, as pt needs extra time.   Pt completes supervised bathing, this date washing his buttocks independently with steadying assist while standing.    Pt requires extra time to dry himself and recover and is escorted to bedside to replace pads on cervical collar and for inspection of wound by RN.   During  BADL session pt reports new plan to reside with his girlfriend partially or completely s/p d/c.   Pt's mother reports awareness of this plan with reservations relating to pt's safety however she states that pt's father and her have decided to allow pt to decide for himself.   OT advised pt against any role in child care d/t safety concerns for child and pt.   Pt declared that he had a long phone conservation with his girlfriend's mother who has assured pt that  child care issues will be handled by alternate family members instead of pt.   Pt left supine in bed awaiting RN arrival to replace dressing.        Therapy Documentation Precautions:  Precautions Precautions: Fall, Cervical Required Braces or Orthoses: Cervical Brace Cervical Brace: Hard collar, At all times Restrictions Weight Bearing Restrictions: No  Vital Signs: Therapy Vitals Temp: 98.2 F (36.8 C) Temp Source: Oral Pulse Rate: 70 Resp: 16 BP: (!) 148/85 mmHg Patient Position (if appropriate): Lying Oxygen Therapy SpO2: 100 % O2 Device: Not Delivered   Pain: Pain Assessment Pain Assessment: 0-10 Pain Score: Asleep Faces Pain Scale: Hurts a little bit Pain Type: Acute pain Pain Location: Neck Pain Orientation: Left Pain Descriptors / Indicators: Aching Pain Frequency: Constant Pain Onset: On-going Patients Stated Pain Goal: 3 Pain Intervention(s): Medication (See eMAR)  See Function Navigator for Current Functional Status.   Therapy/Group: Individual Therapy   Nichol Ator 03/12/2015, 7:23 AM
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
## 2015-03-12 NOTE — Progress Notes (Signed)
Social Work Patient ID: Craig Hoffman, male   DOB: April 12, 1996, 19 y.o.   MRN: [id number] Met with Mom who states: " I want my name off of his chart, he is on his own."  Pt has decided he plans to go home with his girlfriend-Keisha Mom is very upset with this and has decided along with his father-she spoke with on the phone to back out and prepare for the worse. She feels he will not Have any help where he is and feels it is not a safe environment for him. She does not know who lives there besides his girlfriend and her two children, but feel It is a Technical brewer of people. She will not be here at any more of his sessions. She reports anytime pt is in trouble she gets a call and she is tired of this.  If he is an adult than he needs to Take responsibility for himself. Mom aware we are here to support her and she was appreciative of this. Will discuss plan with pt and inform him his girlfriend needs to be here for education.

## 2015-03-12 NOTE — Progress Notes (Signed)
Physical Therapy Session Note  Patient Details  Name: Craig Hoffman MRN: [id number] Date of Birth: 01/04/1996  Today's Date: 03/12/2015 PT Individual Time: 1500-1600 PT Individual Time Calculation (min): 60 min   Short Term Goals: Week 1:  PT Short Term Goal 1 (Week 1): Patient will be able to perform all bed mobility with Supervision.  PT Short Term Goal 2 (Week 1): Patient will be able to perform transfer supine to sit with Supervision PT Short Term Goal 3 (Week 1): Patient will be able to perform bed to w/c transfers with SBA PT Short Term Goal 4 (Week 1): Patient will be able to participate in gait on a distance of 100 feet with min A with LRAD  Skilled Therapeutic Interventions/Progress Updates:    Patient seen for 1:1 session focus of treatment on gait and balance.  Ambulated with RW supervision x 150'.  Worked on gait on ramp/curb and compliant surfaces with RW minguard assist after demonstration and with cues for technique.  Patient ambulated with SPC x 90' and 70' min assist and cues for sequence trial in left versus right hand.  Standing balance activities including alternate taps to cones with cane for support, then taps to 4" step no AD minguard assist.  Patient standing on balance beam feet apart, then together then with trunk rotation touching hand on opposite side all supervision to occ minguard.  Standing tandem on beam close S minguard x 30 sec with each foot position.  Side stepping no UE support, then in agility ladder gait through with stepping in and out of ladder with HHA, then tiptoe with two feet per square and HHA.  Patient ambulated back to room w/ RW supervision and toileted with supervision.  Left in supine with all needs in reach.  Therapy Documentation Precautions:  Precautions Precautions: Fall, Cervical Required Braces or Orthoses: Cervical Brace Cervical Brace: Hard collar, At all times Restrictions Weight Bearing Restrictions: No Pain: Pain  Assessment Pain Score: 8  Pain Type: Acute pain Pain Location: Neck Pain Orientation: Left Pain Descriptors / Indicators: Aching Pain Onset: On-going Pain Intervention(s): Ambulation/increased activity;Distraction   See Function Navigator for Current Functional Status.   Therapy/Group: Individual Therapy  Rudi CocoWYNN,CYNDI  Cyndi Spring[place]ynn, [place] [phone] 03/12/2015  03/12/2015, 4:21 PM

## 2015-03-13 ENCOUNTER — Inpatient Hospital Stay (HOSPITAL_COMMUNITY): Payer: Medicaid Other | Admitting: Physical Therapy

## 2015-03-13 ENCOUNTER — Inpatient Hospital Stay (HOSPITAL_COMMUNITY): Payer: Medicaid Other | Admitting: Occupational Therapy

## 2015-03-13 MED ORDER — PREGABALIN 25 MG PO CAPS
25.0000 mg | ORAL_CAPSULE | Freq: Two times a day (BID) | ORAL | Status: DC
  Administered 2015-03-13 – 2015-03-14 (×4): 25 mg via ORAL
  Filled 2015-03-13 (×4): qty 1

## 2015-03-13 MED ORDER — OXYCODONE HCL 5 MG PO TABS
5.0000 mg | ORAL_TABLET | ORAL | Status: DC | PRN
  Administered 2015-03-13 – 2015-03-16 (×7): 10 mg via ORAL
  Filled 2015-03-13 (×7): qty 2

## 2015-03-13 NOTE — Progress Notes (Signed)
[facility] [facility] PHYSICAL MEDICINE & REHABILITATION     PROGRESS NOTE    Subjective/Complaints:  Pt sleeping with girlfriend in bed  ROS: + shooting pain in bilateral upper extremities.   Objective: Vital Signs: Blood pressure 128/83, pulse 60, temperature 98.4 F (36.9 C), temperature source Oral, resp. rate 17, height 6' (1.829 m), weight 74.662 kg (164 lb 9.6 oz), SpO2 100 %. No results found. No results for input(s): WBC, HGB, HCT, PLT in the last 72 hours. No results for input(s): NA, K, CL, GLUCOSE, BUN, CREATININE, CALCIUM in the last 72 hours.  Invalid input(s): CO CBG (last 3)  No results for input(s): GLUCAP in the last 72 hours.  Wt Readings from Last 3 Encounters:  03/06/15 74.662 kg (164 lb 9.6 oz) (66 %*, Z = 0.42)  03/01/15 72 kg (158 lb 11.7 oz) (58 %*, Z = 0.21)   * Growth percentiles are based on CDC 2-20 Years data.    Physical Exam:  BP 128/83 mmHg  Pulse 60  Temp(Src) 98.4 F (36.9 C) (Oral)  Resp 17  Ht 6' (1.829 m)  Wt 74.662 kg (164 lb 9.6 oz)  BMI 22.32 kg/m2  SpO2 100%   Assessment/Plan: 1. 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 Physiatrist is providing close team supervision and 24 hour management of active medical problems listed below. Physiatrist and rehab team continue to assess barriers to discharge/monitor patient progress toward functional and medical goals.  Function:  Bathing Bathing position Bathing activity did not occur: Refused Position: Shower  Bathing parts Body parts bathed by patient: Right arm, Left arm, Chest, Abdomen, Front perineal area, Right upper leg, Left upper leg, Right lower leg, Left lower leg Body parts bathed by helper: Buttocks  Bathing assist Assist Level: Touching or steadying assistance(Pt > 75%)       Upper Body Dressing/Undressing Upper body dressing   What is the patient wearing?: Pull over shirt/dress     Pull over shirt/dress - Perfomed by patient: Thread/unthread right sleeve, Thread/unthread left sleeve Pull over shirt/dress - Perfomed by helper: Put head through opening, Pull shirt over trunk        Upper body assist Assist Level: Touching or steadying assistance(Pt > 75%)      Lower Body Dressing/Undressing Lower body dressing   What is the patient wearing?: Underwear, Pants, Non-skid slipper socks Underwear - Performed by patient: Thread/unthread right underwear leg, Thread/unthread left underwear leg, Pull underwear up/down Underwear - Performed by helper: Thread/unthread right underwear leg, Thread/unthread left underwear leg, Pull underwear up/down Pants- Performed by patient: Thread/unthread right pants leg, Thread/unthread left pants leg, Pull pants up/down, Fasten/unfasten pants Pants- Performed by helper: Thread/unthread right pants leg, Thread/unthread left pants leg, Pull pants up/down Non-skid slipper socks- Performed by patient: Don/doff right sock, Don/doff left sock Non-skid slipper socks- Performed by helper: Don/doff right sock, Don/doff left sock                  Lower body assist Assist for lower body dressing: Set up      Toileting Toileting Toileting activity did not occur: No continent bowel/bladder event (using urinal) Toileting steps completed by patient: Adjust clothing prior to toileting, Performs perineal hygiene, Adjust clothing after toileting Toileting steps completed by helper: Performs perineal hygiene Toileting Assistive Devices: Grab bar or Customer service managerrail  Toileting assist Assist  level: Supervision or verbal cues   Transfers Chair/bed transfer   Chair/bed transfer method: Ambulatory Chair/bed transfer assist level: Supervision or verbal cues Chair/bed transfer assistive device: Armrests, Patent attorney     Max  distance: 150 Assist level: Touching or steadying assistance (Pt > 75%) (supervision with walker, min assist with SPC)   Wheelchair Wheelchair activity did not occur: Safety/medical concerns Type: Manual      Cognition Comprehension Comprehension assist level: Understands basic 90% of the time/cues < 10% of the time  Expression Expression assist level: Expresses basic 90% of the time/requires cueing < 10% of the time.  Social Interaction Social Interaction assist level: Interacts appropriately 75 - 89% of the time - Needs redirection for appropriate language or to initiate interaction.  Problem Solving Problem solving assist level: Solves basic 90% of the time/requires cueing < 10% of the time  Memory Memory assist level: Recognizes or recalls 90% of the time/requires cueing < 10% of the time    Medical Problem List and Plan: 1. 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 DVT Prophylaxis/Anticoagulation: Mechanical: Sequential compression devices, below knee Bilateral lower extremities 3. Pain Management: Ultram scheduled. Continue oxycodone prn for now. psych added gabapentin and cymbalta (just increased to ) which should help with pain as well so will start to wean Lyrica reduced to  BID,psych  increased gabapentin to  TID Reduce Oxy IR to 5-10mg  ,  majority of pain is neurogenic     8. Anxiety and Major Depression  -appreciate Psychiatry consult 9.  Hx of drug overdose, intentional, wean narcotics LOS (Days) 7 A FACE TO FACE EVALUATION WAS PERFORMED  Claudette Laws E 03/13/2015 7:29 AM

## 2015-03-13 NOTE — Progress Notes (Signed)
Social Work Patient ID: Craig Hoffman, male   DOB: 1995/08/06, 19 y.o.   MRN: [id number] Met with pt, girlfriend and friend were here to observe in therapies and learn his care.  Pt emotional due to friends saying they are praying for him and this makes him tear up. Pt is smiling and brighter today with girlfriend here. Will work toward discharge next Wed and equipment needs and follow up needs.

## 2015-03-13 NOTE — Progress Notes (Signed)
Physical Therapy Weekly Progress Note  Patient Details  Name: Craig Hoffman MRN: [id number] Date of Birth: 1995-10-15  Beginning of progress report period: March 06, 2015 End of progress report period: March 13, 2015  Today's Date: 03/13/2015 PT Individual Time: 0830-0900 Treatment Session 2: 1300-1415 PT Individual Time Calculation (min): 30 min Treatment Session 2: 75 minutes  Patient has met 4 of 4 short term goals.  Pt's B LE strength, dynamic standing balance, and all aspects of functional mobility have improved over the past week. Pt does continue to demonstrate compensatory trunk leans, have reduced muscular endurance, and continued balance deficits req an assistive device and intermittent hands on assist for safety.   Patient continues to demonstrate the following deficits: impaired dynamic standing balance, limited activity tolerance, difficulty with gait on level surfaces and stairs and therefore will continue to benefit from skilled PT intervention to enhance overall performance with activity tolerance, balance, ability to compensate for deficits, functional use of  right upper extremity, right lower extremity, left upper extremity and left lower extremity and coordination.  Patient progressing toward long term goals..  Continue plan of care.  PT Short Term Goals Week 1:  PT Short Term Goal 1 (Week 1): Patient will be able to perform all bed mobility with Supervision.  PT Short Term Goal 1 - Progress (Week 1): Met PT Short Term Goal 2 (Week 1): Patient will be able to perform transfer supine to sit with Supervision PT Short Term Goal 2 - Progress (Week 1): Met PT Short Term Goal 3 (Week 1): Patient will be able to perform bed to w/c transfers with SBA PT Short Term Goal 3 - Progress (Week 1): Met PT Short Term Goal 4 (Week 1): Patient will be able to participate in gait on a distance of 100 feet with min A with LRAD PT Short Term Goal 4 - Progress (Week 1): Met Week 2:   PT Short Term Goal 1 (Week 2): STGs = LTGs due to ELOS  Skilled Therapeutic Interventions/Progress Updates:    Treatment Session 1: Pt received in bed, sleeping next to girlfriend - agreeable to PT. Therapeutic Activity - see function tab for bed mobility, UE dressing, and toileting details. Pt uses SPC to practice ambulating in/out of bathroom - 1 LOB req min A to correct. Gait Training - see function tab for details. Pt req SBA and verbal cues for sequencing up/down stairs with SPC and 1 rail. PT instructs pt to take smaller steps and focus on balance when ambulating from room to/from gym - steadying assist overall and 3 point gait pattern. Pt is continuing to progress with lesser restrictive assistive device, but balance continues to be impaired. Pt ended up in w/c in room, agreeing not to stand without assist from therapist or nurse, so that he could brush his teeth.   Treatment Session 2: Pt received in bed with friend and girlfriend present - finishing up eating lunch. Gait Training - Pt ambulates in hallway with SPC in L hand - 3 point to 2 point gait req CGA-close SBA. Neuromuscular Reeducation - PT administers FGA - see below for details - pt scores 9/30, indicating significant balance impairment. PT instructs pt in [place] level C exercises and gives pt written handout with specifics for parameters and safety for pt to practice at home. Pt ended in room with all needs in reach. Pt is continuing to progress with balance and functional mobility - able to ambulate with close SBA with SPC by end  of second session, today. Continue per PT POC.    Therapy Documentation Precautions:  Precautions Precautions: Fall, Cervical Required Braces or Orthoses: Cervical Brace Cervical Brace: Hard collar, At all times Restrictions Weight Bearing Restrictions: No Pain: Pain Assessment Pain Assessment: 0-10 Pain Score: 8  Faces Pain Scale: Hurts a little bit Pain Type: Acute pain Pain Location:  Shoulder Pain Orientation: Left Pain Descriptors / Indicators: Aching Pain Frequency: Constant Pain Onset: On-going Patients Stated Pain Goal: 2 Pain Intervention(s): Rest;Emotional support Multiple Pain Sites: No  Treatment Session 2: Pt c/o 5/10 pain in B legs - PT uses emotional support and distraction to decrease pt's pain.   Balance:   Functional Gait Assessment (FGA) Requirements: A marked 6-m (20-ft) walkway that is marked with a 30.48-cm (12-in) width.  _1_ 1. GAIT LEVEL SURFACE: 16.76 sec with SPC Instructions: Walk at your normal speed from here to the next mark (6 m[20 ft]). Grading: [place] the highest category that applies. (3) Normal-Walks 6 m (20 ft) in less than 5.5 seconds, no assistive devices, good speed, no evidence for imbalance, normal gait pattern, deviates no more than 15.24 cm (6 in) outside of the 30.48-cm (12-in) walkway width. (2) Mild impairment-Walks 6 m (20 ft) in less than 7 seconds but greater than 5.5 seconds, uses assistive device, slower speed, mild gait deviations, or deviates 15.24-25.4 cm (6-10 in) outside of the 30.48-cm (12-in) walkway width. (1) Moderate impairment-Walks 6 m (20 ft), slow speed, abnormal gait pattern, evidence for imbalance, or deviates 25.4-38.1 cm (10-15 in) outside of the 30.48-cm (12-in) walkway width. Requires more than 7 seconds to ambulate 6 m (20 ft). (0) Severe impairment-Cannot walk 6 m (20 ft) without assistance,severe gait deviations or imbalance, deviates greater than 38.1 cm (15 in) outside of the 30.48-cm (12-in) walkway width or reaches and touches the wall.  _1_ 2. CHANGE IN GAIT SPEED Instructions: Begin walking at your normal pace (for 1.5 m [5 ft]). When I tell you "go," walk as fast as you can (for 1.5 m [5 ft]). When I tell you "slow," walk as slowly as you can (for 1.5 m [5 ft]). Grading: [place] the highest category that applies. (3) Normal-Able to smoothly change walking speed without loss of balance or gait  deviation. Shows a significant difference in walking speeds between normal, fast, and slow speeds. Deviates no more than 15.24 cm (6 in) outside of the 30.48-cm (12-in) walkway width. (2) Mild impairment-Is able to change speed but demonstrates mild gait deviations, deviates 15.24-25.4 cm (6-10 in) outside of the 30.48-cm (12-in) walkway width, or no gait deviations but unable to achieve a significant change in velocity, or uses an assistive device. (1) Moderate impairment-Makes only minor adjustments to walking speed, or accomplishes a change in speed with significant gait deviations, deviates 25.4-38.1 cm (10-15 in) outside the 30.48-cm (12-in) walkway width, or changes speed but loses balance but is able to recover and continue walking. (0) Severe impairment-Cannot change speeds, deviates greater than 38.1 cm (15 in) outside 30.48-cm (12-in) walkway width, or loses balance and has to reach for wall or be caught.  _0_ 3. GAIT WITH HORIZONTAL HEAD TURNS: rotate shoulders R & L since neck in hard c-collar Instructions: Walk from here to the next mark 6 m (20 ft) away. Begin walking at your normal pace. Keep walking straight; after 3 steps, turn your head to the right and keep walking straight while looking to the right. After 3 more steps, turn your head to the left and  keep walking straight while looking left. Continue alternating looking right and left every 3 steps until you have completed 2 repetitions in each direction. Grading: [place] the highest category that applies. (3) Normal-Performs head turns smoothly with no change in gait. Deviates no more than 15.24 cm (6 in) outside 30.48-cm (12-in) walkway width. (2) Mild impairment-Performs head turns smoothly with slight change in gait velocity (eg, minor disruption to smooth gait path), deviates 15.24-25.4 cm (6-10 in) outside 30.48-cm (12-in) walkway width, or uses an assistive device.  (1) Moderate impairment-Performs head turns with moderate change in  gait velocity, slows down, deviates 25.4-38.1 cm (10-15 in) outside 30.48-cm (12-in) walkway width but recovers, can continue to walk. (0) Severe impairment-Performs task with severe disruption of gait (eg, staggers 38.1 cm [15 in] outside 30.48-cm (12-in) walkway width, loses balance, stops, or reaches for wall).  _1_ 4. GAIT WITH VERTICAL HEAD TURNS Instructions: Walk from here to the next mark (6 m [20 ft]). Begin walking at your normal pace. Keep walking straight; after 3 steps, tip your head up and keep walking straight while looking up. After 3 more steps, tip your head down, keep walking straight while looking down. Continue alternating looking up and down every 3 steps until you have completed 2 repetitions in each direction. Grading: [place] the highest category that applies. (3) Normal-Performs head turns with no change in gait. Deviates no more than 15.24 cm (6 in) outside 30.48-cm (12-in) walkway width. (2) Mild impairment-Performs task with slight change in gait velocity (eg, minor disruption to smooth gait path), deviates 15.24-25.4 cm (6-10 in) outside 30.48-cm (12-in) walkway width or uses assistive device. (1) Moderate impairment-Performs task with moderate change in gait velocity, slows down, deviates 25.4-38.1 cm (10-15 in) outside 30.48-cm (12-in) walkway width but recovers, can continue to walk. (0) Severe impairment-Performs task with severe disruption of gait (eg, staggers 38.1 cm [15 in] outside 30.48-cm (12-in) walkway width, loses balance, stops, reaches for wall).  _2_ 5. GAIT AND PIVOT TURN Instructions: Begin with walking at your normal pace. When I tell you, "turn and stop," turn as quickly as you can to face the opposite direction and stop. Grading: [place] the highest category that applies. (3) Normal-Pivot turns safely within 3 seconds and stops quickly with no loss of balance. (2) Mild impairment-Pivot turns safely in _3 seconds and stops with no loss of balance, or pivot  turns safely within 3 seconds and stops with mild imbalance, requires small steps to catch balance. (1) Moderate impairment-Turns slowly, requires verbal cueing, or requires several small steps to catch balance following turn and stop. (0) Severe impairment-Cannot turn safely, requires assistance to turn and stop.  _1_ 6. STEP OVER OBSTACLE: steps over 2 shoe boxes, but must slow down Instructions: Begin walking at your normal speed. When you come to the shoe box, step over it, not around it, and keep walking. Grading: [place] the highest category that applies. (3) Normal-Is able to step over 2 stacked shoe boxes taped together (22.86 cm [9 in] total height) without changing gait speed; no evidence of imbalance. (2) Mild impairment-Is able to step over one shoe box (11.43 cm [4.5 in] total height) without changing gait speed; no evidence of imbalance. (1) Moderate impairment-Is able to step over one shoe box (11.43 cm [4.5 in] total height) but must slow down and adjust steps to clear box safely. May require verbal cueing. (0) Severe impairment-Cannot perform without assistance.  __0 7. GAIT WITH NARROW BASE OF SUPPORT: 3 steps with  LOB req min A then 15 steps with close SBA Instructions: Walk on the floor with arms folded across the chest, feet aligned heel to toe in tandem for a distance of 3.6 m [12 ft]. The number of steps taken in a straight line are counted for a maximum of 10 steps. Grading: [place] the highest category that applies. (3) Normal-Is able to ambulate for 10 steps heel to toe with no staggering. (2) Mild impairment-Ambulates 7-9 steps. (1) Moderate impairment-Ambulates 4-7 steps. (0) Severe impairment-Ambulates less than 4 steps heel to toe or cannot perform without assistance.  _1_ 8. GAIT WITH EYES CLOSED: 20.63 seconds Instructions: Walk at your normal speed from here to the next mark (6 m [20 ft]) with your eyes closed. Grading: [place] the highest category that applies. (3)  Normal-Walks 6 m (20 ft), no assistive devices, good speed, no evidence of imbalance, normal gait pattern, deviates no more than 15.24 cm (6 in) outside 30.48-cm (12-in) walkway width. Ambulates 6 m (20 ft) in less than 7 seconds. (2) Mild impairment-Walks 6 m (20 ft), uses assistive device, slower speed, mild gait deviations, deviates 15.24-25.4 cm (6-10 in) outside 30.48-cm (12-in) walkway width. Ambulates 6 m (20 ft) in less than 9 seconds but greater than 7 seconds. (1) Moderate impairment-Walks 6 m (20 ft), slow speed, abnormal gait pattern, evidence for imbalance, deviates 25.4-38.1 cm (10-15 in) outside 30.48-cm (12-in) walkway width. Requires more than 9 seconds to ambulate 6 m (20 ft). (0) Severe impairment-Cannot walk 6 m (20 ft) without assistance, severe gait deviations or imbalance, deviates greater than 38.1 cm (15 in) outside 30.48-cm (12-in) walkway width or will not attempt task.  _1_ 9. AMBULATING BACKWARDS Instructions: Walk backwards until I tell you to stop. Grading: [place] the highest category that applies. (3) Normal-Walks 6 m (20 ft), no assistive devices, good speed, no evidence for imbalance, normal gait pattern, deviates no more than 15.24 cm (6 in) outside 30.48-cm (12-in) walkway width. (2) Mild impairment-Walks 6 m (20 ft), uses assistive device, slower speed, mild gait deviations, deviates 15.24-25.4 cm (6-10 in) outside 30.48-cm (12-in) walkway width. (1) Moderate impairment-Walks 6 m (20 ft), slow speed, abnormal gait pattern, evidence for imbalance, deviates 25.4-38.1 cm (10-15 in) outside 30.48-cm (12-in) walkway width. (0) Severe impairment-Cannot walk 6 m (20 ft) without assistance, severe gait deviations or imbalance, deviates greater than 38.1 cm (15 in) outside 30.48-cm (12-in) walkway width or will not attempt task.  1__ 10. STEPS: step over step up with cane & rail; step-to down with cane & rail - SBA Instructions: Walk up these stairs as you would at home (ie,  using the rail if necessary). At the top turn around and walk down. Grading: [place] the highest category that applies. (3) Normal-Alternating feet, no rail. (2) Mild impairment-Alternating feet, must use rail. (1) Moderate impairment-Two feet to a stair; must use rail. (0) Severe impairment-Cannot do safely.  TOTAL SCORE: __9____ /30 (MAXIMUM SCORE=30)  Scores of ? 22/30 on the FGA were found to be effective in predicting falls, Sensitivity 85%, Specificity 86% Scores of ? 20/30 on the FGA were optimal to predict older adults residing in community dwellings who would sustain unexplained falls in the next 6 months, Sensitivity 100%, Specificity 76% ([place], 2010; aged 83 to 70, Older Adults) Foreman: 4.2 points for CVA Augustin Coupe et al, 2010) MCID: 8 points for Balance and Vestibular Disorders Marjorie Smolder and Augustin Coupe, 2014)    See Function Navigator for Current Functional Status.  Therapy/Group: Individual  Therapy  Shanena Pellegrino M 03/13/2015, 8:34 AM

## 2015-03-13 NOTE — Progress Notes (Signed)
Occupational Therapy Session Note  Patient Details  Name: Craig Hoffman MRN: [id number] Date of Birth: 03/04/1996  Today's Date: 03/13/2015 OT Individual Time: 1600-1630  (30 min)  OT Individual Time Calculation (min): 60 min    Short Term Goals: Week 1:  OT Short Term Goal 1 (Week 1): Pt will perform LB dressing with mod A in order to decrease assist with self care. OT Short Term Goal 2 (Week 1): Pt will perform UB dressing with set up A in order to increase I in self care. OT Short Term Goal 3 (Week 1): Pt will perform toileting with steady assist in order to increase I with self care. OT Short Term Goal 4 (Week 1): Pt will perform toilet transfer onto elevated toilet seat with steady assist in order to increase I with self care. Week 2:     Skilled Therapeutic Interventions/Progress Updates:    Amubulated to toilet with [place] and SBA.  Stood to urinate.  [facility] and close supervision.  Nustep= 280, 4 wkload, 5 minutes.  Left toe curls intermittently during exercise.  Ambulated back to room and left with all needs.    Therapy Documentation Precautions:  Precautions Precautions: Fall, Cervical Required Braces or Orthoses: Cervical Brace Cervical Brace: Hard collar, At all times Restrictions Weight Bearing Restrictions: No General:     Pain: Pain Assessment Pain Score: 7         See Function Navigator for Current Functional Status.   Therapy/Group: Individual Therapy  [name], Wallie Lagrand J 03/13/2015, 4:27 PM

## 2015-03-13 NOTE — Progress Notes (Signed)
Occupational Therapy Session Note  Patient Details  Name: Craig Hoffman MRN: [id number] Date of Birth: 07/29/1995  Today's Date: 03/13/2015 OT Individual Time: 1050-1150 OT Individual Time Calculation (min): 60 min    Short Term Goals: Week 1:  OT Short Term Goal 1 (Week 1): Pt will perform LB dressing with mod A in order to decrease assist with self care. OT Short Term Goal 2 (Week 1): Pt will perform UB dressing with set up A in order to increase I in self care. OT Short Term Goal 3 (Week 1): Pt will perform toileting with steady assist in order to increase I with self care. OT Short Term Goal 4 (Week 1): Pt will perform toilet transfer onto elevated toilet seat with steady assist in order to increase I with self care.  Skilled Therapeutic Interventions/Progress Updates:    Pt seen this session to facilitate functional mobility with self care. Pt received in bed talking on phone and very upset that his family was not present for education. Pt called them and was upset. First portion of session, pt very verbal and discussing his situation. Needed cuing to get focused on therapy session. Once pt began, he was quite independent retrieving items he needed, ambulating into/out of bathroom with RW. He was able to stand with bars in shower and showered with supervision only. Good progress with standing balance.  After shower, pt donned lower body clothing.  He then layed down to have wet neck brace removed. His girlfriend did arrive at this point and provided education to her on how to change out neck brace pads. She successfully place dry pads in brace. Nurse arrived to provide education to GF on wound bandage change.   Therapy Documentation Precautions:  Precautions Precautions: Fall, Cervical Required Braces or Orthoses: Cervical Brace Cervical Brace: Hard collar, At all times Restrictions Weight Bearing Restrictions: No   Pain: Pain Assessment Pain Score: 5  - nursing provided  medication ADL:    See Function Navigator for Current Functional Status.   Therapy/Group: Individual Therapy  SAGUIER,JULIA 03/13/2015, 1:45 PM

## 2015-03-14 ENCOUNTER — Inpatient Hospital Stay (HOSPITAL_COMMUNITY): Payer: Medicaid Other | Admitting: Physical Therapy

## 2015-03-14 NOTE — Progress Notes (Signed)
At 0200, patient yelled out, upon entering room, told staff to "get out now!" Offered to help patient, but continued to yell for staff to, "leave me alone"! Around 0230, patient calmer. Reports had sharp pain in leg, earlier, when yelling. Alfredo MartinezMurray, Truitt Cruey A

## 2015-03-14 NOTE — Progress Notes (Signed)
[place] PHYSICAL MEDICINE & REHABILITATION     PROGRESS NOTE    Subjective/Complaints:  Pt with anxiety/pain over night---has since calmed down this monring.   ROS: + shooting pain in bilateral upper extremities--doesn't offer much more. Kept eyes closed when i visited.   Objective: Vital Signs: Blood pressure 127/64, pulse 63, temperature 98.3 F (36.8 C), temperature source Oral, resp. rate 20, height 6' (1.829 m), weight 70.761 kg (156 lb), SpO2 99 %. No results found. No results for input(s): WBC, HGB, HCT, PLT in the last 72 hours. No results for input(s): NA, K, CL, GLUCOSE, BUN, CREATININE, CALCIUM in the last 72 hours.  Invalid input(s): CO CBG (last 3)  No results for input(s): GLUCAP in the last 72 hours.  Wt Readings from Last 3 Encounters:  03/13/15 70.761 kg (156 lb) (54 %*, Z = 0.10)  03/01/15 72 kg (158 lb 11.7 oz) (58 %*, Z = 0.21)   * Growth percentiles are based on CDC 2-20 Years data.    Physical Exam:  BP 127/64 mmHg  Pulse 63  Temp(Src) 98.3 F (36.8 C) (Oral)  Resp 20  Ht 6' (1.829 m)  Wt 70.761 kg (156 lb)  BMI 21.15 kg/m2  SpO2 99%   Assessment/Plan: 1. 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 Physiatrist is providing close team supervision and 24 hour management of active medical problems listed below. Physiatrist and rehab team continue to assess barriers to discharge/monitor patient progress toward functional and medical goals.  Function:  Bathing Bathing position Bathing activity did not occur: Refused Position: Systems developerhower  Bathing parts Body parts bathed by patient: Right arm, Left arm, Chest, Abdomen, Front perineal area, Right upper leg, Left upper leg, Right lower leg, Left lower leg, Buttocks Body parts bathed by helper: Buttocks   Bathing assist Assist Level: Supervision or verbal cues      Upper Body Dressing/Undressing Upper body dressing   What is the patient wearing?: Pull over shirt/dress     Pull over shirt/dress - Perfomed by patient: Thread/unthread right sleeve, Thread/unthread left sleeve, Put head through opening, Pull shirt over trunk Pull over shirt/dress - Perfomed by helper: Put head through opening, Pull shirt over trunk        Upper body assist Assist Level: Set up   Set up : To obtain clothing/put away  Lower Body Dressing/Undressing Lower body dressing   What is the patient wearing?: Underwear, Pants, Non-skid slipper socks Underwear - Performed by patient: Thread/unthread right underwear leg, Thread/unthread left underwear leg, Pull underwear up/down Underwear - Performed by helper: Thread/unthread right underwear leg, Thread/unthread left underwear leg, Pull underwear up/down Pants- Performed by patient: Thread/unthread right pants leg, Thread/unthread left pants leg, Pull pants up/down, Fasten/unfasten pants Pants- Performed by helper: Thread/unthread right pants leg, Thread/unthread left pants leg, Pull pants up/down Non-skid slipper socks- Performed by patient: Don/doff right sock, Don/doff left sock Non-skid slipper socks- Performed by helper: Don/doff right sock, Don/doff left sock                  Lower body assist Assist for lower body dressing: Set up      Toileting Toileting Toileting activity did not occur: No continent bowel/bladder event (using urinal) Toileting steps completed by patient: Adjust clothing prior to toileting, Performs perineal hygiene, Adjust clothing after toileting Toileting steps completed  by helper: Performs perineal hygiene Toileting Assistive Devices: Other (comment) [facility])  Toileting assist Assist level: Supervision or verbal cues   Transfers Chair/bed transfer   Chair/bed transfer method: Ambulatory Chair/bed transfer assist level:  Supervision or verbal cues Chair/bed transfer assistive device: Hospital doctor     Max distance: 62' + 210' Assist level: Touching or steadying assistance (Pt > 75%)   Wheelchair Wheelchair activity did not occur: Safety/medical concerns Type: Manual      Cognition Comprehension Comprehension assist level: Understands basic 90% of the time/cues < 10% of the time  Expression Expression assist level: Expresses basic 90% of the time/requires cueing < 10% of the time.  Social Interaction Social Interaction assist level: Interacts appropriately 50 - 74% of the time - May be physically or verbally inappropriate.  Problem Solving Problem solving assist level: Solves basic 75 - 89% of the time/requires cueing 10 - 24% of the time  Memory Memory assist level: Recognizes or recalls 75 - 89% of the time/requires cueing 10 - 24% of the time    Medical Problem List and Plan: 1. 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 DVT Prophylaxis/Anticoagulation: Mechanical: Sequential compression devices, below knee Bilateral lower extremities 3. Pain Management: Ultram scheduled. Continue oxycodone prn for now. psych added gabapentin (increased to  tid) and cymbalta (just increased to ) which should help with pain as well so will start to wean Lyrica reduced to  BID on Friday---taper to off  Reduce Oxy IR to 5-10mg  ,  majority of pain is neurogenic     3. Anxiety and Major Depression  -appreciate Psychiatry consult 4.  Hx of drug overdose, intentional, wean narcotics   LOS (Days) 8 A FACE TO FACE EVALUATION WAS PERFORMED  SWARTZ,ZACHARY T 03/14/2015 8:16 AM

## 2015-03-14 NOTE — Progress Notes (Signed)
Nutrition Brief Note  Patient identified on the Malnutrition Screening Tool (MST) Report Hx severe recurrent depression. Weight hx shows usual body weight 160-165# range.  Wt Readings from Last 15 Encounters:  03/13/15 156 lb (70.761 kg) (54 %*, Z = 0.10)  03/01/15 158 lb 11.7 oz (72 kg) (58 %*, Z = 0.21)   * Growth percentiles are based on CDC 2-20 Years data.    Body mass index is 21.15 kg/(m^2). Patient meets criteria for normal range based on current BMI. No physical evidence of malnutrition.  Pt has multiple pizza boxes and cinnamon buns sitting of his table and nursing says he is eating very well and c/o being hungry if his tray is not there when he is expecting it. Current diet order is regular, patient is consuming approximately  75-100% of meals at this time. Labs and medications reviewed.   No nutrition interventions warranted at this time. If nutrition issues arise, please consult RD.   Royann ShiversLynn Vonte Rossin MS,RD,CSG,LDN Office: [phone] Pager: [phone]

## 2015-03-14 NOTE — Progress Notes (Signed)
In therapy. Refused Miralax at this time and may request later today since this is a daily med.

## 2015-03-14 NOTE — Progress Notes (Signed)
Physical Therapy Session Note  Patient Details  Name: Craig Hoffman MRN: [id number] Date of Birth: 04/06/1996  Today's Date: 03/14/2015 PT Individual Time: 0800-0900 PT Individual Time Calculation (min): 60 min   Short Term Goals: Week 2:  PT Short Term Goal 1 (Week 2): STGs = LTGs due to ELOS  Skilled Therapeutic Interventions/Progress Updates:    Pt received sleeping in bed with girlfriend next to him - hard collar in place. Therapeutic Activity - see function tab for details. All bed mobility mod I from flat bed without rails. Pt requests to toilet prior to leaving room. Pt uses SPC to ambulate in/out of bathroom and reports he continues to need BSC over toilet for AD. Pt refuses to don TED hose this AM, and is unhappy to participate in PT due to early time. Pt eats breakfast after toileting - no assistance req. Pt demonstrates ability to [facility]ulate [facility] with SBA for safety. See function tab for upper body and lower body dressing details. Gait Training - see function tab for details. Pt ambulates room to gym with SPC with SBA - no LOB req assist to correct. PT instructs pt in ambulation without AD - focus on increasing gait speed and allowing arms to swing naturally by side - multiple turns and up/down low ramp req SBA for safety, > 1,000'. Neuromuscular Reeducation - PT instructs pt in high level dynamic balance activities: side stepping, backwards walking, and grapevine walking req SBA for first 2 and CGA during grapevine for safety. Pt ended in room with all needs in reach. Pt is continuing to make functional progress and improvements in balance. Continue per PT POC.   Therapy Documentation Precautions:  Precautions Precautions: Fall, Cervical Required Braces or Orthoses: Cervical Brace Cervical Brace: Hard collar, At all times Restrictions Weight Bearing Restrictions: No Pain: Pain Assessment Pain Assessment: 0-10 Pain Score: 10-Worst pain ever Pain Type: Acute  pain;Neuropathic pain Pain Location: Generalized Pain Descriptors / Indicators: Aching Pain Onset: On-going Pain Intervention(s): RN made aware Multiple Pain Sites: No  See Function Navigator for Current Functional Status.   Therapy/Group: Individual Therapy  Kellene Mccleary M 03/14/2015, 8:05 AM

## 2015-03-14 NOTE — Progress Notes (Signed)
Mother found necklace in laundry and will take it home.

## 2015-03-14 NOTE — Progress Notes (Signed)
Spoke with patient at 12:05 pm regarding safety with him being a confidential patient and having increased number of visitors and expressed difficulty of security and staff keeping him safe and/or confidential. Patient stated, "I told someone last week that I don't want to be confidential". Message relayed to house supervisor. Patient stated he is not afraid and no one is after him.  Assisted with bath around 16:12 and patient states he placed a beaded white/tan looking tribal necklace on his bed. After linen was changed and sent down linen shoot, pt informed staff that his necklace was left on bed. Spoke with [employer] staff and was informed that dirty linen had already been placed on truck. Patient and his mother made aware.
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
## 2015-03-15 ENCOUNTER — Inpatient Hospital Stay (HOSPITAL_COMMUNITY): Payer: Medicaid Other | Admitting: Occupational Therapy

## 2015-03-15 DIAGNOSIS — M5412 Radiculopathy, cervical region: Secondary | ICD-10-CM

## 2015-03-15 DIAGNOSIS — F411 Generalized anxiety disorder: Secondary | ICD-10-CM

## 2015-03-15 MED ORDER — PREGABALIN 25 MG PO CAPS: 25.0000 mg | ORAL_CAPSULE | Freq: Every day | ORAL | Status: DC

## 2015-03-15 MED ORDER — GABAPENTIN 300 MG PO CAPS
300.0000 mg | ORAL_CAPSULE | Freq: Three times a day (TID) | ORAL | Status: DC
  Administered 2015-03-15 – 2015-03-18 (×8): 300 mg via ORAL
  Filled 2015-03-15 (×8): qty 1

## 2015-03-15 NOTE — Progress Notes (Signed)
Occupational Therapy Session Note  Patient Details  Name: Craig Hoffman MRN: [id number] Date of Birth: 03/22/1996  Today's Date: 03/15/2015 OT Individual Time:  -   0900-1000   (60 min)    Short Term Goals: Week 1:  OT Short Term Goal 1 (Week 1): Pt will perform LB dressing with mod A in order to decrease assist with self care. OT Short Term Goal 2 (Week 1): Pt will perform UB dressing with set up A in order to increase I in self care. OT Short Term Goal 3 (Week 1): Pt will perform toileting with steady assist in order to increase I with self care. OT Short Term Goal 4 (Week 1): Pt will perform toilet transfer onto elevated toilet seat with steady assist in order to increase I with self care. :    :     Skilled Therapeutic Interventions/Progress Updates:  Pt upset because GF not present and cursing to people on phone    Pt retrieved items he needed for dressing.  Ambulated with [place] to toilet.  Continent of urine.  OT turned on shower water per pt's request.  He bathed on shower seat with SBA.  Ambulated  Into  bathroom with [place]. He was able to stand with bars in shower and showered with distant supervision only.No LOB with standing balance. After shower, pt donned lower body clothing. He then layed down to have wet neck brace removed.   Pt left lying in bed with all needs in place.  Pt calling people about getting his gf out of the bed.    Therapy Documentation Precautions:  Precautions Precautions: Fall, Cervical Required Braces or Orthoses: Cervical Brace Cervical Brace: Hard collar, At all times Restrictions Weight Bearing Restrictions: No      Pain: Pain Assessment Pain Assessment:3/10 Pain Score: 3/10 Pain Type: Acute pain;Neuropathic pain Pain Location: Arm Pain Orientation: Right;Left Pain Descriptors / Indicators: Aching;Sore Pain Frequency: Intermittent Pain Onset: On-going Pain Intervention(s): Medication (See eMAR) Multiple Pain Sites: No ADL:         See  Function Navigator for Current Functional Status.   Therapy/Group: Individual Therapy  [name], [name] 03/15/2015, 7:50 AM

## 2015-03-15 NOTE — Progress Notes (Signed)
2100 on phone arguing with girlfriend, then complained of heart and chest pain. Vitals WNL, Dr. Riley KillSwartz made aware, no new orders at this time. During night patient yelling out, reports feeling neck was twisted. Cervical collar in place. Later reports, "sometimes I freak out!" PRN oxy IR given at 0001. Alfredo MartinezMurray, Aquarius Tremper A

## 2015-03-15 NOTE — Progress Notes (Signed)
[place] PHYSICAL MEDICINE & REHABILITATION     PROGRESS NOTE    Subjective/Complaints:  Dramatics again overnight, involving girlfriend this time. Had chest, neck pain----improved this morning when I saw him. Asking if he can have his oxycodone more frequently   ROS: limited other than above.   Objective: Vital Signs: Blood pressure 137/69, pulse 63, temperature 98.8 F (37.1 C), temperature source Oral, resp. rate 16, height 6' (1.829 m), weight 70.761 kg (156 lb), SpO2 100 %. No results found. No results for input(s): WBC, HGB, HCT, PLT in the last 72 hours. No results for input(s): NA, K, CL, GLUCOSE, BUN, CREATININE, CALCIUM in the last 72 hours.  Invalid input(s): CO CBG (last 3)  No results for input(s): GLUCAP in the last 72 hours.  Wt Readings from Last 3 Encounters:  03/13/15 70.761 kg (156 lb) (54 %*, Z = 0.10)  03/01/15 72 kg (158 lb 11.7 oz) (58 %*, Z = 0.21)   * Growth percentiles are based on CDC 2-20 Years data.    Physical Exam:  BP 137/69 mmHg  Pulse 63  Temp(Src) 98.8 F (37.1 C) (Oral)  Resp 16  Ht 6' (1.829 m)  Wt 70.761 kg (156 lb)  BMI 21.15 kg/m2  SpO2 100%  General: Alert and oriented x 3, No apparent distress HEENT: Head is normocephalic, atraumatic, PERRLA, EOMI, sclera anicteric, oral mucosa pink and moist, dentition intact,  Neck: in cervical collar Heart: Reg rate and rhythm. No murmurs rubs or gallops Chest: CTA bilaterally without wheezes, rales, or rhonchi; no distress Abdomen: Soft, non-tender, non-distended, bowel sounds positive. Extremities: No clubbing, cyanosis, or edema. Pulses are 2+ Skin: Clean and intact without signs of breakdown Neuro: Pt is cognitively appropriate with normal insight, memory, and awareness. Cranial nerves 2-12 are intact. Sensory exam is normal. Reflexes are 2+ in all 4's. Fine motor coordination is intact. No tremors. LUE movement limited by pain..  Musculoskeletal: neck tender in collar, left arm  painful to touch.   Psych: Pt's affect is appropriate. Pt is cooperative    Assessment/Plan: 1. 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 Physiatrist is providing close team supervision and 24 hour management of active medical problems listed below. Physiatrist and rehab team continue to assess barriers to discharge/monitor patient progress toward functional and medical goals.  Function:  Bathing Bathing position Bathing activity did not occur: Refused Position: Systems developer parts bathed by patient: Right arm, Left arm, Chest, Abdomen, Front perineal area, Right upper leg, Left upper leg, Right lower leg, Left lower leg, Buttocks Body parts bathed by helper: Buttocks  Bathing assist Assist Level: Supervision or verbal cues      Upper Body Dressing/Undressing Upper body dressing   What is the patient wearing?: Pull over shirt/dress     Pull over shirt/dress - Perfomed by patient: Thread/unthread right sleeve, Thread/unthread left sleeve, Put head through opening, Pull shirt over trunk Pull over shirt/dress - Perfomed by helper: Put head through opening, Pull shirt over trunk        Upper body assist Assist Level: Set up   Set up : To obtain clothing/put away  Lower Body Dressing/Undressing Lower body dressing   What is the patient wearing?: Socks, Shoes Underwear - Performed by patient: Thread/unthread right underwear leg, Thread/unthread left underwear leg, Pull underwear up/down Underwear -  Performed by helper: Thread/unthread right underwear leg, Thread/unthread left underwear leg, Pull underwear up/down Pants- Performed by patient: Thread/unthread right pants leg, Thread/unthread left pants leg, Pull pants up/down, Fasten/unfasten pants Pants- Performed by  helper: Thread/unthread right pants leg, Thread/unthread left pants leg, Pull pants up/down Non-skid slipper socks- Performed by patient: Don/doff right sock, Don/doff left sock Non-skid slipper socks- Performed by helper: Don/doff right sock, Don/doff left sock Socks - Performed by patient: Don/doff right sock, Don/doff left sock   Shoes - Performed by patient: Don/doff right shoe, Don/doff left shoe            Lower body assist Assist for lower body dressing: Set up      [employer] Toileting Toileting activity did not occur: No continent bowel/bladder event (using urinal) Toileting steps completed by patient: Adjust clothing prior to toileting, Performs perineal hygiene, Adjust clothing after toileting Toileting steps completed by helper: Performs perineal hygiene Toileting Assistive Devices: Other (comment) [facility])  Toileting assist Assist level: Supervision or verbal cues   Transfers Chair/bed transfer   Chair/bed transfer method: Ambulatory Chair/bed transfer assist level: No Help, no cues, assistive device, takes more than a reasonable amount of time Chair/bed transfer assistive device: Hospital doctorCane     Locomotion Ambulation     Max distance: 52210' with SPC; > 1,000' without AD Assist level: Supervision or verbal cues   Wheelchair Wheelchair activity did not occur: Safety/medical concerns Type: Manual      Cognition Comprehension Comprehension assist level: Follows complex conversation/direction with no assist  Expression Expression assist level: Expresses complex ideas: With no assist  Social Interaction Social Interaction assist level: Interacts appropriately 50 - 74% of the time - May be physically or verbally inappropriate. ("freaks out" at times)  Problem Solving Problem solving assist level: Solves complex 90% of the time/cues < 10% of the time  Memory Memory assist level: Complete Independence: No helper    Medical Problem List and Plan: 1. 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 DVT Prophylaxis/Anticoagulation: Mechanical: Sequential compression devices, below knee Bilateral lower extremities 3. Pain Management:   -psych added gabapentin (increase to 300mg  tid today) and cymbalta (just increased to 40mg )     - wean Lyrica to 25mg  qhs  -continue with oxycodone as written q4 prn with intention to wean off  -tramadol scheduled.     3. Anxiety and Major Depression  -appreciate Psychiatry consult  -psychosocial issues playing a large role 4.  Hx of drug overdose, intentional, wean narcotics   LOS (Days) 9 A FACE TO FACE EVALUATION WAS PERFORMED  SWARTZ,ZACHARY T 03/15/2015 8:40 AM

## 2015-03-15 NOTE — Progress Notes (Signed)
Pt's significant other in for dressing change. Pt initially irritated that pt's was late for education. Verbal interchangement between pt and significant other, with use of inappropriate language and profanity. Significant other finally agreeable to participate and observe. No hands-on at this time. Appropriate questions asked. Significant other agreeable to returning tomorrow for hands on and return demonstration.

## 2015-03-16 ENCOUNTER — Inpatient Hospital Stay (HOSPITAL_COMMUNITY): Payer: Medicaid Other

## 2015-03-16 ENCOUNTER — Inpatient Hospital Stay (HOSPITAL_COMMUNITY): Payer: Medicaid Other | Admitting: Occupational Therapy

## 2015-03-16 ENCOUNTER — Inpatient Hospital Stay (HOSPITAL_COMMUNITY): Payer: Self-pay

## 2015-03-16 ENCOUNTER — Inpatient Hospital Stay (HOSPITAL_COMMUNITY): Payer: Self-pay | Admitting: Physical Therapy

## 2015-03-16 DIAGNOSIS — S12391S Other nondisplaced fracture of fourth cervical vertebra, sequela: Secondary | ICD-10-CM

## 2015-03-16 DIAGNOSIS — M791 Myalgia: Secondary | ICD-10-CM

## 2015-03-16 LAB — RAPID URINE DRUG SCREEN, HOSP PERFORMED
Amphetamines: NOT DETECTED
Barbiturates: NOT DETECTED
Benzodiazepines: NOT DETECTED
Cocaine: NOT DETECTED
Opiates: NOT DETECTED
Tetrahydrocannabinol: NOT DETECTED

## 2015-03-16 LAB — BASIC METABOLIC PANEL
Anion gap: 10 (ref 5–15)
BUN: 5 mg/dL — AB (ref 6–20)
CALCIUM: 9.8 mg/dL (ref 8.9–10.3)
CO2: 28 mmol/L (ref 22–32)
CREATININE: 0.99 mg/dL (ref 0.61–1.24)
Chloride: 99 mmol/L — ABNORMAL LOW (ref 101–111)
GFR calc Af Amer: >60 mL/min (ref >60)
GFR calc non Af Amer: >60 mL/min (ref >60)
GLUCOSE: 94 mg/dL (ref 65–99)
Potassium: 3.8 mmol/L (ref 3.5–5.1)
Sodium: 137 mmol/L (ref 135–145)

## 2015-03-16 LAB — CBC WITH DIFFERENTIAL/PLATELET
BASOS PCT: 1 %
Basophils Absolute: 0.1 10*3/uL (ref 0.0–0.1)
EOS ABS: 0.2 10*3/uL (ref 0.0–0.7)
Eosinophils Relative: 3 %
HCT: 42.9 % (ref 39.0–52.0)
Hemoglobin: 14.7 g/dL (ref 13.0–17.0)
Lymphocytes Relative: 27 %
Lymphs Abs: 1.3 10*3/uL (ref 0.7–4.0)
MCH: 28.7 pg (ref 26.0–34.0)
MCHC: 34.3 g/dL (ref 30.0–36.0)
MCV: 83.8 fL (ref 78.0–100.0)
MONO ABS: 0.5 10*3/uL (ref 0.1–1.0)
MONOS PCT: 11 %
Neutro Abs: 2.9 10*3/uL (ref 1.7–7.7)
Neutrophils Relative %: 58 %
Platelets: 237 10*3/uL (ref 150–400)
RBC: 5.12 MIL/uL (ref 4.22–5.81)
RDW: 12.6 % (ref 11.5–15.5)
WBC: 4.9 10*3/uL (ref 4.0–10.5)

## 2015-03-16 MED ORDER — TRAMADOL HCL 50 MG PO TABS: 50.0000 mg | ORAL_TABLET | Freq: Four times a day (QID) | ORAL | Status: DC

## 2015-03-16 MED ORDER — SODIUM CHLORIDE 0.9 % IV SOLN: 250.0000 mL | INTRAVENOUS | Status: DC | PRN

## 2015-03-16 MED ORDER — ETODOLAC 200 MG PO CAPS
200.0000 mg | ORAL_CAPSULE | Freq: Two times a day (BID) | ORAL | Status: DC
  Administered 2015-03-16 – 2015-03-18 (×5): 200 mg via ORAL
  Filled 2015-03-16 (×9): qty 1

## 2015-03-16 MED ORDER — CLONAZEPAM 0.5 MG PO TBDP: 0.5000 mg | ORAL_TABLET | Freq: Two times a day (BID) | ORAL | Status: DC | PRN

## 2015-03-16 MED ORDER — SODIUM CHLORIDE 0.9 % IJ SOLN
3.0000 mL | Freq: Two times a day (BID) | INTRAMUSCULAR | Status: DC
  Administered 2015-03-16: 3 mL via INTRAVENOUS

## 2015-03-16 MED ORDER — SODIUM CHLORIDE 0.9 % IV BOLUS (SEPSIS)
500.0000 mL | Freq: Once | INTRAVENOUS | Status: AC
  Administered 2015-03-16: 500 mL via INTRAVENOUS

## 2015-03-16 MED ORDER — SODIUM CHLORIDE 0.9 % IV SOLN
INTRAVENOUS | Status: DC
  Administered 2015-03-16: 11:00:00 via INTRAVENOUS

## 2015-03-16 MED ORDER — OXYCODONE HCL 5 MG PO TABS
5.0000 mg | ORAL_TABLET | Freq: Four times a day (QID) | ORAL | Status: DC | PRN
  Administered 2015-03-17: 10 mg via ORAL
  Filled 2015-03-16: qty 2

## 2015-03-16 MED ORDER — MUSCLE RUB 10-15 % EX CREA
1.0000 "application " | TOPICAL_CREAM | Freq: Three times a day (TID) | CUTANEOUS | Status: DC
  Administered 2015-03-16 – 2015-03-17 (×4): 1 via TOPICAL
  Filled 2015-03-16: qty 85

## 2015-03-16 MED ORDER — SODIUM CHLORIDE 0.9 % IJ SOLN: 3.0000 mL | INTRAMUSCULAR | Status: DC | PRN

## 2015-03-16 MED ORDER — CLONAZEPAM 0.5 MG PO TBDP
0.5000 mg | ORAL_TABLET | Freq: Two times a day (BID) | ORAL | Status: DC | PRN
  Administered 2015-03-16: 0.5 mg via ORAL
  Filled 2015-03-16: qty 1

## 2015-03-16 NOTE — Progress Notes (Signed)
Physical Therapy Cancellation Note  Patient Details  Name: Craig Hoffman MRN: [id number] Date of Birth: 01/15/1996  Today's Date: 03/16/2015 PT Missed Time: 60 Minutes Missed Time Reason: Patient ill (Comment) (Sore all over due to spasms earlier today)  Short Term Goals: Week 2:  PT Short Term Goal 1 (Week 2): STGs = LTGs due to ELOS  Skilled Therapeutic Interventions/Progress Updates:     Patient unable to participate due to pain from spasms earlier today.  Spoke with RN who witnessed spasms and reports finally controlled with medication. Therapy Documentation General: PT Amount of Missed Time (min): 60 Minutes PT Missed Treatment Reason: Patient ill (Comment) (Sore all over due to spasms earlier today)    Rudi CocoWYNN,CYNDI  Cyndi ReddingWynn, [place] CarolinaPT [phone] 03/16/2015  03/16/2015, 3:48 PM

## 2015-03-16 NOTE — Progress Notes (Signed)
[facility] PHYSICAL MEDICINE & REHABILITATION     PROGRESS NOTE    Subjective/Complaints:  Girlfriend sleeping in chair RN notes Right shoulder pain, no recent falls  ROS: limited other than above.   Objective: Vital Signs: Blood pressure 141/85, pulse 65, temperature 99.2 F (37.3 C), temperature source Oral, resp. rate 18, height 6' (1.829 m), weight 70.761 kg (156 lb), SpO2 98 %. No results found. No results for input(s): WBC, HGB, HCT, PLT in the last 72 hours. No results for input(s): NA, K, CL, GLUCOSE, BUN, CREATININE, CALCIUM in the last 72 hours.  Invalid input(s): CO CBG (last 3)  No results for input(s): GLUCAP in the last 72 hours.  Wt Readings from Last 3 Encounters:  03/13/15 70.761 kg (156 lb) (54 %*, Z = 0.10)  03/01/15 72 kg (158 lb 11.7 oz) (58 %*, Z = 0.21)   * Growth percentiles are based on CDC 2-20 Years data.    Physical Exam:  BP 141/85 mmHg  Pulse 65  Temp(Src) 99.2 F (37.3 C) (Oral)  Resp 18  Ht 6' (1.829 m)  Wt 70.761 kg (156 lb)  BMI 21.15 kg/m2  SpO2 98%  General: Sleeping but awakens to voice MSK- no pain to palp RIght shoulder , Mild tenderness R trap     Assessment/Plan: 1. 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 Physiatrist is providing close team supervision and 24 hour management of active medical problems listed below. Physiatrist and rehab team continue to assess barriers to discharge/monitor patient progress toward functional and medical goals.  Function:  Bathing Bathing position Bathing activity did not occur: Refused Position: Systems developer parts bathed by patient: Right arm, Left arm, Chest, Abdomen, Front perineal area, Right upper leg, Left upper leg, Right lower leg, Left lower leg, Buttocks,  Back Body parts bathed by helper: Buttocks  Bathing assist Assist Level: Supervision or verbal cues      Upper Body Dressing/Undressing Upper body dressing   What is the patient wearing?: Pull over shirt/dress     Pull over shirt/dress - Perfomed by patient: Thread/unthread right sleeve, Thread/unthread left sleeve, Put head through opening, Pull shirt over trunk Pull over shirt/dress - Perfomed by helper: Put head through opening, Pull shirt over trunk        Upper body assist Assist Level: Set up   Set up : To obtain clothing/put away  Lower Body Dressing/Undressing Lower body dressing   What is the patient wearing?: Socks, Underwear, Pants Underwear - Performed by patient: Thread/unthread right underwear leg, Thread/unthread left underwear leg, Pull underwear up/down Underwear - Performed by helper: Thread/unthread right underwear leg, Thread/unthread left underwear leg, Pull underwear up/down Pants- Performed by patient: Thread/unthread right pants leg, Thread/unthread left pants leg, Pull pants up/down, Fasten/unfasten pants Pants- Performed by helper: Thread/unthread right pants leg, Thread/unthread left pants leg, Pull pants up/down Non-skid slipper socks- Performed by patient: Don/doff right sock, Don/doff left sock Non-skid slipper socks- Performed by helper: Don/doff right sock, Don/doff left sock Socks - Performed by patient: Don/doff right sock, Don/doff left sock   Shoes - Performed by patient: Don/doff right shoe, Don/doff left shoe            Lower body assist Assist for lower body dressing: Set up      [employer] activity did not  occur: No continent bowel/bladder event (using urinal) Toileting steps completed by patient: Adjust clothing prior to toileting, Performs perineal hygiene, Adjust clothing after toileting Toileting steps completed by helper: Performs perineal hygiene Toileting Assistive Devices: Other (comment) [facility](SPC)  Toileting  assist Assist level: Supervision or verbal cues   Transfers Chair/bed transfer   Chair/bed transfer method: Ambulatory Chair/bed transfer assist level: No Help, no cues, assistive device, takes more than a reasonable amount of time Chair/bed transfer assistive device: Hospital doctorCane     Locomotion Ambulation     Max distance: 44210' with SPC; > 1,000' without AD Assist level: Supervision or verbal cues   Wheelchair Wheelchair activity did not occur: Safety/medical concerns Type: Manual      Cognition Comprehension Comprehension assist level: Follows complex conversation/direction with extra time/assistive device  Expression Expression assist level: Expresses basic needs/ideas: With no assist  Social Interaction Social Interaction assist level: Interacts appropriately 50 - 74% of the time - May be physically or verbally inappropriate.  Problem Solving Problem solving assist level: Solves complex 90% of the time/cues < 10% of the time  Memory Memory assist level: Complete Independence: No helper    Medical Problem List and Plan: 1. 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 DVT Prophylaxis/Anticoagulation: Mechanical: Sequential compression devices, below knee Bilateral lower extremities 3. Pain Management:   -psych added gabapentin (increase to 300mg  tid today) and cymbalta (just increased to 40mg )     - D/C Lyrica   -Reduce  oxycodone as written q6 prn with intention to wean off  -tramadol scheduled,.add NSAID             -myofascial pain R trap will add myoflex   3. Anxiety and Major Depression  -appreciate Psychiatry consult  -psychosocial issues playing a large role 4.  Hx of drug overdose, intentional, wean narcotics   LOS (Days) 10 A FACE TO FACE EVALUATION WAS PERFORMED  KIRSTEINS,ANDREW E 03/16/2015 7:11 AM

## 2015-03-16 NOTE — Progress Notes (Signed)
Patient having significant myoclonus likely due to serotonin effect of ultram and recently increased dose of cymbalta. Will discontinue ultram--has already had both medications this morning. Will start IVF for supportive care and to help clear medication. Klonopin added additionally to help with symptoms. Side effects of medications discussed with patient and girlfriend as well as medication changes.

## 2015-03-16 NOTE — Progress Notes (Signed)
Occupational Therapy Session Note  Patient Details  Name: Craig Hoffman MRN: [id number] Date of Birth: 06/25/1995  Today's Date: 03/16/2015 OT Individual Time:0830-0830  OT Individual Time Calculation (min): 0 min    Short Term Goals: Week 1:  OT Short Term Goal 1 (Week 1): Pt will perform LB dressing with mod A in order to decrease assist with self care. OT Short Term Goal 2 (Week 1): Pt will perform UB dressing with set up A in order to increase I in self care. OT Short Term Goal 3 (Week 1): Pt will perform toileting with steady assist in order to increase I with self care. OT Short Term Goal 4 (Week 1): Pt will perform toilet transfer onto elevated toilet seat with steady assist in order to increase I with self care.  Skilled Therapeutic Interventions/Progress Updates: "Get out of my damn room, I'm not doing therapy."  Pt received supine in bed, covers over his head with girlfriend resting in recliner beside bed.   Pt refused therapy and would not engage, explain or negotiate with provider.   Therapy cancelled per pt., request with no medical justification offered or revealed.    Therapy Documentation Precautions:  Precautions Precautions: Fall, Cervical Required Braces or Orthoses: Cervical Brace Cervical Brace: Hard collar, At all times Restrictions Weight Bearing Restrictions: No  Vital Signs: Therapy Vitals Temp:  (refused.) Pulse Rate: 65 Resp: 18 BP: (!) 141/85 mmHg Patient Position (if appropriate): Lying Oxygen Therapy SpO2: 98 % O2 Device: Not Delivered   Pain: Pain Assessment Pain Assessment: 0-10 Pain Score: Asleep Pain Type: Acute pain Pain Location: Neck Pain Orientation: Right Pain Descriptors / Indicators: Spasm Pain Frequency: Intermittent Pain Onset: On-going Pain Intervention(s): Medication (See eMAR) Multiple Pain Sites: No   See Function Navigator for Current Functional Status.   Therapy/Group: Individual Therapy   Second session: Time:  1400-1440 Time Calculation (min):  40 min  Pain Assessment: No/denies pain (reports ongoing paresthesias, BLE)  Skilled Therapeutic Interventions: ADL-retraining with focus on pt/family education with girlfriend present to assist.   GF gathers clothing and assists pt with contact guard and steadying assist during transfers (to toilet and shower chair).   Pt bathes unassisted but requires cues to inhibit neck extension while bathing under chin/neck and cervical collar.   Pt dresses at edge of bed and is received by RN to address wound care at left upper shoulder.   Pt confirms new plan to reside at his girlfriends house; pt confirms need for tub bench only although he plans to purchase a hand shower privately.   No LOB noted during mobility with SPC.  See FIM for current functional status  Therapy/Group: Individual Therapy  Evangelia Whitaker 03/16/2015, 8:43 AM

## 2015-03-16 NOTE — Progress Notes (Signed)
Physical Therapy Session Note  Patient Details  Name: Craig Hoffman MRN: [id number] Date of Birth: 08/05/1995  Pt missed 30 minutes skilled PT due to RN recommending hold due to pt having spasms x 1 hour earlier today and pt having received meds and needing to rest. Therapy Documentation General: PT Amount of Missed Time (min): 30 Minutes PT Missed Treatment Reason: Patient ill (Comment) (RN recommended due to spasms and meds today)      Shogo Larkey 03/16/2015, 11:58 AM

## 2015-03-16 NOTE — Progress Notes (Signed)
Occupational Therapy Note  Patient Details  Name: Craig Pattersonerrance Bosler MRN: [id number] Date of Birth: 05/01/1996   Pt missed 30 mins OT session this pm secondary to not wanting to participate.  Pt stated that his legs have been painful all day and he is not getting up.  Noted pt refusing most of his therapy sessions today.   Wiliam Cauthorn OTR/L 03/16/2015, 3:38 PM

## 2015-03-17 ENCOUNTER — Inpatient Hospital Stay (HOSPITAL_COMMUNITY): Payer: Self-pay

## 2015-03-17 ENCOUNTER — Inpatient Hospital Stay (HOSPITAL_COMMUNITY): Payer: Self-pay | Admitting: Physical Therapy

## 2015-03-17 ENCOUNTER — Inpatient Hospital Stay (HOSPITAL_COMMUNITY): Payer: Medicaid Other | Admitting: Physical Therapy

## 2015-03-17 MED ORDER — OXYCODONE HCL 5 MG PO TABS
5.0000 mg | ORAL_TABLET | Freq: Four times a day (QID) | ORAL | Status: DC | PRN
  Administered 2015-03-17: 5 mg via ORAL
  Filled 2015-03-17: qty 1

## 2015-03-17 MED ORDER — CLONAZEPAM 0.125 MG PO TBDP
0.2500 mg | ORAL_TABLET | Freq: Two times a day (BID) | ORAL | Status: DC | PRN
  Filled 2015-03-17: qty 2

## 2015-03-17 NOTE — Progress Notes (Signed)
Social Work Patient ID: Craig Hoffman, male   DOB: 01/01/1996, 19 y.o.   MRN: [id number] Given pt information regarding [facility] and appointment. He feels he has no issue with marijuana use and declined any services. He just wants to go home and rest and be with his girlfriend. Aware the detective/police department has his belongings-ie clothes, shoes, etc. Mom confirms this. Pt to contact [name] regarding his property.

## 2015-03-17 NOTE — Progress Notes (Signed)
Physical Therapy Session Note  Patient Details  Name: Craig Hoffman MRN: [id number] Date of Birth: 1996/05/09  Today's Date: 03/17/2015 PT Individual Time: 8421-0312 PT Individual Time Calculation (min): 40 min   Short Term Goals: Week 2:  PT Short Term Goal 1 (Week 2): STGs = LTGs due to ELOS  Skilled Therapeutic Interventions/Progress Updates:   Pt received supine in bed and agreeable to therapy.  Session focused on patient education, gait training with no AD, stair training, and transfers.  Pt initially asking how to set up TTB, so PT demonstrated. Pt asked appropriate questions and PT answered questions to pt's satisfaction.  PT instructed patient in ambulation with no AD x150' with distant supervision for safety.  Pt able to negotiate 12 steps with intermittent use of hand rail to steady self and supervision.  Pt had one anterior LOB descending stairs but able to self correct with no assist from PT.  Pt demonstrated car transfer at SUV height with mod I.  Pt returned to room at end of session, able to tidy room with distant supervision and position self supine in bed with tray table positioned to eat dinner. Call bell in reach and needs met.    Therapy Documentation Precautions:  Precautions Precautions: Fall, Cervical Required Braces or Orthoses: Cervical Brace Cervical Brace: Hard collar, At all times Restrictions Weight Bearing Restrictions: No Pain: Pain Assessment Pain Assessment: 0-10 Pain Score: 3  Pain Location: Shoulder Pain Orientation: Right Pain Intervention(s): Emotional support   See Function Navigator for Current Functional Status.   Therapy/Group: Individual Therapy  Millianna Szymborski E Penven-Crew 03/17/2015, 5:10 PM

## 2015-03-17 NOTE — Progress Notes (Signed)
Physical Therapy Note  Patient Details  Name: Craig Hoffman MRN: [id number] Date of Birth: 06/10/1995 Today's Date: 03/17/2015    Pt received supine in bed with covers over head; unwilling to participate or communicate with therapist regarding session. Encouraged pt to participate however still unwilling. Missed 60 minutes PT.  [name] 03/17/2015, 1:29 PM

## 2015-03-17 NOTE — Progress Notes (Signed)
Physical Therapy Discharge Summary  Patient Details  Name: Craig Hoffman MRN: [id number] Date of Birth: [birth year]  Patient has met 8 of 8 long term goals due to improved activity tolerance, improved balance, improved postural control, increased strength, decreased pain, improved awareness and improved coordination.  Patient to discharge at an ambulatory level Modified Independent.   Patient's care partner is independent to provide the necessary assistance at discharge.  Recommendation:  Patient will benefit from HEP to continue to advance safe functional mobility, address ongoing impairments in strength, sensation, balance, proprioception, and activity tolerance, and minimize fall risk.  Equipment: SPC  Reasons for discharge: treatment goals met and discharge from hospital  Patient/family agrees with progress made and goals achieved: Yes  PT Discharge Precautions/Restrictions Precautions Precautions: Fall;Cervical Required Braces or Orthoses: Cervical Brace Cervical Brace: Hard collar;At all times Restrictions Weight Bearing Restrictions: No Pain Pain Assessment Pain Assessment: 0-10 Pain Score: 3  Pain Location: Shoulder Pain Orientation: Right Pain Intervention(s): Emotional support Cognition Overall Cognitive Status: Within Functional Limits for tasks assessed Arousal/Alertness: Awake/alert Orientation Level: Oriented X4 Memory: Appears intact Awareness: Impaired Problem Solving: Appears intact Safety/Judgment: Impaired Sensation Sensation Stereognosis: Not tested Hot/Cold: Appears Intact Proprioception: Impaired by gross assessment Coordination Gross Motor Movements are Fluid and Coordinated: Yes Fine Motor Movements are Fluid and Coordinated: No Motor    WFL Mobility Bed Mobility Bed Mobility: Rolling Right;Rolling Left;Supine to Sit;Sit to Supine Rolling Right: 7: Independent Rolling Left: 7: Independent Left Sidelying to Sit: 6: Modified independent  (Device/Increase time) Supine to Sit: 6: Modified independent (Device/Increase time) Transfers Transfers: Yes Sit to Stand: 6: Modified independent (Device/Increase time) Locomotion  Ambulation Ambulation: Yes Ambulation/Gait Assistance: 5: Supervision Ambulation Distance (Feet): 150 Feet Assistive device: None Gait Gait: Yes Gait Pattern: Impaired Gait Pattern: Decreased dorsiflexion - left;Decreased dorsiflexion - right;Narrow base of support;Step-through pattern Stairs / Additional Locomotion Stairs: Yes Stairs Assistance: 5: Supervision Stair Management Technique: Two rails;One rail Right;One rail Left Number of Stairs: 12  Trunk/Postural Assessment  Cervical Assessment Cervical Assessment: Exceptions to WFL (hard CO at all times) Thoracic Assessment Thoracic Assessment: Within Functional Limits Lumbar Assessment Lumbar Assessment: Within Functional Limits Postural Control Postural Control: Within Functional Limits  Balance Dynamic Sitting Balance Dynamic Sitting - Level of Assistance: 7: Independent Static Standing Balance Static Standing - Balance Support: Right upper extremity supported;Left upper extremity supported;Bilateral upper extremity supported Static Standing - Level of Assistance: 6: Modified independent (Device/Increase time) Dynamic Standing Balance Dynamic Standing - Balance Support: During functional activity;Left upper extremity supported;Right upper extremity supported Dynamic Standing - Level of Assistance: 6: Modified independent (Device/Increase time) Dynamic Standing - Balance Activities: Reaching for objects;Reaching across midline;Lateral lean/weight shifting;Forward lean/weight shifting Extremity Assessment  RUE Assessment RUE Assessment: Within Functional Limits LUE Assessment LUE Assessment: Within Functional Limits  R LE: WFL L LE: WFL     See Function Navigator for Current Functional Status.  Caitlin E Penven-Crew 03/18/2015, 12:36  PM

## 2015-03-17 NOTE — Plan of Care (Signed)
Problem: RH SKIN INTEGRITY Goal: RH STG SKIN FREE OF INFECTION/BREAKDOWN Pt will remain free of infection and skin breakdown with min assistance.  Outcome: Not Met (add Reason) Wound requires total assist, due to daily dressing change and location of wound

## 2015-03-17 NOTE — Progress Notes (Signed)
Occupational Therapy Discharge Summary  Patient Details  Name: Craig Hoffman MRN: [id number] Date of Birth: [birth year]  Patient has met 17 of 10 long term goals due to improved activity tolerance, improved balance and ability to compensate for deficits.  Pt made steady progress with BADLs this admission.  Pt required encouragement to participate in therapies during this admission.  Pt continues to exhibit decreased safety awareness. Pt's mom has been present for therapy sessions. Patient to discharge at overall Supervision level.  Patient's mother  is independent to provide the necessary physical assistance at discharge; however pt's girlfriend may also be providing care but has not been present for education.    Recommendation:  Patient will benefit from ongoing skilled OT services in home health setting to continue to advance functional skills in the area of BADL.  Equipment: No equipment provided  Reasons for discharge: treatment goals met and discharge from hospital  Patient/family agrees with progress made and goals achieved: Yes  OT Discharge     Vision/Perception  Vision- History Baseline Vision/History: Wears glasses Wears Glasses: At all times Patient Visual Report: No change from baseline Vision- Assessment Vision Assessment?: No apparent visual deficits  Cognition Overall Cognitive Status: Within Functional Limits for tasks assessed Orientation Level: Oriented X4 Memory: Appears intact Awareness: Impaired Problem Solving: Appears intact Safety/Judgment: Impaired Sensation Sensation Stereognosis: Not tested Hot/Cold: Appears Intact Proprioception: Impaired by gross assessment Coordination Gross Motor Movements are Fluid and Coordinated: Yes Fine Motor Movements are Fluid and Coordinated: No Trunk/Postural Assessment  Cervical Assessment Cervical Assessment: Exceptions to WFL (aspen collars at all times) Thoracic Assessment Thoracic Assessment: Within Functional  Limits Lumbar Assessment Lumbar Assessment: Within Functional Limits Postural Control Postural Control: Within Functional Limits  Balance Dynamic Sitting Balance Dynamic Sitting - Level of Assistance: 7: Independent Extremity/Trunk Assessment RUE Assessment RUE Assessment: Within Functional Limits LUE Assessment LUE Assessment: Within Functional Limits   See Function Navigator for Current Functional Status.  Craig Hoffman [facility] 03/17/2015, 2:53 PM

## 2015-03-17 NOTE — Progress Notes (Signed)
Uneventful night. No PRN meds given thus far tonight. Awake talking/yelling on phone until 0100. Calling for assistance to BR. Alfredo MartinezMurray, Gracie Gupta A

## 2015-03-17 NOTE — Progress Notes (Signed)
Occupational Therapy Session Note  Patient Details  Name: Craig Hoffman MRN: [id number] Date of Birth: 07/02/1995  Today's Date: 03/17/2015 OT Individual Time: 0900-1000 OT Individual Time Calculation (min): 60 min    Short Term Goals: Week 1:  OT Short Term Goal 1 (Week 1): Pt will perform LB dressing with mod A in order to decrease assist with self care. OT Short Term Goal 2 (Week 1): Pt will perform UB dressing with set up A in order to increase I in self care. OT Short Term Goal 3 (Week 1): Pt will perform toileting with steady assist in order to increase I with self care. OT Short Term Goal 4 (Week 1): Pt will perform toilet transfer onto elevated toilet seat with steady assist in order to increase I with self care.  Skilled Therapeutic Interventions/Progress Updates:    Pt asleep upon arrival and required max multimodal cues and encouragement to awake and participate in therapy.  Pt amb with SPC into bathroom to use toilet and take shower before returning to bed to complete dressing and have pads from [employer] collar and dressing changed.  Pt completed bathing tasks while standing except to bathe feet.  Pt performed all tasks at mod I/I level.  Pt stated that he wasn't going to participate in 11 am PT therapy because he needed to rest.  Info related to PT.    Therapy Documentation Precautions:  Precautions Precautions: Fall, Cervical Required Braces or Orthoses: Cervical Brace Cervical Brace: Hard collar, At all times Restrictions Weight Bearing Restrictions: No   Pain: Pt denied pain  See Function Navigator for Current Functional Status.   Therapy/Group: Individual Therapy  Rich BraveLanier, Sueanne Maniaci Chappell 03/17/2015, 10:05 AM

## 2015-03-17 NOTE — Progress Notes (Signed)
Physical Therapy Session Note  Patient Details  Name: Craig Hoffman MRN: [id number] Date of Birth: Jan 02, 1996  Today's Date: 03/17/2015   -     Short Term Goals: Week 1:  PT Short Term Goal 1 (Week 1): Patient will be able to perform all bed mobility with Supervision.  PT Short Term Goal 1 - Progress (Week 1): Met PT Short Term Goal 2 (Week 1): Patient will be able to perform transfer supine to sit with Supervision PT Short Term Goal 2 - Progress (Week 1): Met PT Short Term Goal 3 (Week 1): Patient will be able to perform bed to w/c transfers with SBA PT Short Term Goal 3 - Progress (Week 1): Met PT Short Term Goal 4 (Week 1): Patient will be able to participate in gait on a distance of 100 feet with min A with LRAD PT Short Term Goal 4 - Progress (Week 1): Met Week 2:  PT Short Term Goal 1 (Week 2): STGs = LTGs due to ELOS  Skilled Therapeutic Interventions/Progress Updates:  Pt asleep in bed.  When PT awakened him, he refused tx; he stated UE hurt.  PT urged him to participate in therapy to improve balance while here, but pt refused.  PT informed RN of pt's c/o pain.    Therapy Documentation Precautions:  Precautions Precautions: Fall, Cervical Required Braces or Orthoses: Cervical Brace Cervical Brace: Hard collar, At all times Restrictions Weight Bearing Restrictions: No General: PT Amount of Missed Time (min): 60 Minutes PT Missed Treatment Reason: Patient unwilling to participate;Pain (stated L UE hurt)   Pain: Pain Assessment Pain Assessment: 0-10      See Function Navigator for Current Functional Status.   Therapy/Group: Individual Therapy  Evalene Vath 03/17/2015, 12:26 PM

## 2015-03-17 NOTE — Progress Notes (Addendum)
[place] PHYSICAL MEDICINE & REHABILITATION     PROGRESS NOTE    Subjective/Complaints:  Myoclonus resolved Feels sore in legs doesn't want to do first therapy today Aware of D/C in am  ROS: limited other than above.   Objective: Vital Signs: Blood pressure 131/69, pulse 65, temperature 98.7 F (37.1 C), temperature source Oral, resp. rate 18, height 6' (1.829 m), weight 70.761 kg (156 lb), SpO2 98 %. No results found.  Recent Labs  03/16/15 1000  WBC 4.9  HGB 14.7  HCT 42.9  PLT 237    Recent Labs  03/16/15 1000  NA 137  K 3.8  CL 99*  GLUCOSE 94  BUN 5*  CREATININE 0.99  CALCIUM 9.8   CBG (last 3)  No results for input(s): GLUCAP in the last 72 hours.  Wt Readings from Last 3 Encounters:  03/13/15 70.761 kg (156 lb) (54 %*, Z = 0.10)  03/01/15 72 kg (158 lb 11.7 oz) (58 %*, Z = 0.21)   * Growth percentiles are based on CDC 2-20 Years data.    Physical Exam:  BP 131/69 mmHg  Pulse 65  Temp(Src) 98.7 F (37.1 C) (Oral)  Resp 18  Ht 6' (1.829 m)  Wt 70.761 kg (156 lb)  BMI 21.15 kg/m2  SpO2 98%  General: Sleeping but awakens to voice MSK- no pain to palp RIght shoulder , Mild tenderness R trap Lungs clear Cor RRR Ext no C/C/E No evidence of clonus at ankles     Assessment/Plan: 1. 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 Physiatrist is providing close team supervision and 24 hour management of active medical problems listed below. Physiatrist and rehab team continue to assess barriers to discharge/monitor patient progress toward functional and medical goals.  Function:  Bathing Bathing position Bathing activity did not occur: Refused Position: Systems developerhower  Bathing parts Body parts bathed by patient: Right arm, Left arm, Chest, Abdomen,  Front perineal area, Buttocks, Right upper leg, Left upper leg, Right lower leg, Left lower leg Body parts bathed by helper: Buttocks  Bathing assist Assist Level: Supervision or verbal cues, Set up      Upper Body Dressing/Undressing Upper body dressing   What is the patient wearing?: Pull over shirt/dress     Pull over shirt/dress - Perfomed by patient: Thread/unthread right sleeve, Thread/unthread left sleeve, Put head through opening, Pull shirt over trunk Pull over shirt/dress - Perfomed by helper: Put head through opening, Pull shirt over trunk        Upper body assist Assist Level: Set up   Set up : To obtain clothing/put away  Lower Body Dressing/Undressing Lower body dressing   What is the patient wearing?: Socks, Underwear, Pants Underwear - Performed by patient: Thread/unthread right underwear leg, Thread/unthread left underwear leg, Pull underwear up/down Underwear - Performed by helper: Thread/unthread right underwear leg, Thread/unthread left underwear leg, Pull underwear up/down Pants- Performed by patient: Thread/unthread right pants leg, Thread/unthread left pants leg, Pull pants up/down, Fasten/unfasten pants Pants- Performed by helper: Thread/unthread right pants leg, Thread/unthread left pants leg, Pull pants up/down Non-skid slipper socks- Performed by patient: Don/doff right sock, Don/doff left sock Non-skid slipper socks- Performed by helper: Don/doff right sock, Don/doff left sock Socks - Performed by patient: Don/doff right sock, Don/doff left sock   Shoes - Performed by patient: Don/doff right  shoe, Don/doff left shoe            Lower body assist Assist for lower body dressing: Set up      Toileting Toileting Toileting activity did not occur: No continent bowel/bladder event Toileting steps completed by patient: Adjust clothing prior to toileting, Performs perineal hygiene, Adjust clothing after toileting Toileting steps completed by helper: Performs  perineal hygiene Toileting Assistive Devices: Other (comment) [facility])  Toileting assist Assist level: No help/no cues   Transfers Chair/bed transfer   Chair/bed transfer method: Ambulatory Chair/bed transfer assist level: No Help, no cues, assistive device, takes more than a reasonable amount of time Chair/bed transfer assistive device: Hospital doctor     Max distance: 59' with SPC; > 1,000' without AD Assist level: Supervision or verbal cues   Wheelchair Wheelchair activity did not occur: Safety/medical concerns Type: Manual      Cognition Comprehension Comprehension assist level: Follows complex conversation/direction with extra time/assistive device  Expression Expression assist level: Expresses basic needs/ideas: With no assist  Social Interaction Social Interaction assist level: Interacts appropriately 50 - 74% of the time - May be physically or verbally inappropriate.  Problem Solving Problem solving assist level: Solves basic 90% of the time/requires cueing < 10% of the time  Memory Memory assist level: Complete Independence: No helper    Medical Problem List and Plan: 1. 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 DVT Prophylaxis/Anticoagulation: Mechanical: Sequential compression devices, below knee Bilateral lower extremities, amb 1000' d/c lovenox 3. Pain Management:   -psych added gabapentin (increase to  tid today) and cymbalta (just increased to )     - D/C Lyrica   -Reduce  oxycodone to  q6 prn with intention to wean off  -,.add NSAID             -myofascial pain R trap will add myoflex   3. Anxiety and Major Depression  -appreciate Psychiatry consult  -psychosocial issues playing a large role 4.  Hx of drug overdose, intentional, wean narcotics 5.  Myoclonus from serotonin syndrome, resolved may  have some muscle soreness today, off tramadol cont cymbalta  LOS (Days) 11 A FACE TO FACE EVALUATION WAS PERFORMED  KIRSTEINS,ANDREW E 03/17/2015 7:08 AM

## 2015-03-17 NOTE — Progress Notes (Signed)
Social Work Patient ID: Craig Hoffman, male   DOB: 1996-01-04, 19 y.o.   MRN: [id number] Met with Verna-RN who reports pt would benefit from a [facility] to check his wound and make sure healing. Pt aware due to medicaid he is not eligible for follow up therapies. Have made referral to [facility] for [facility] follow up. Pt's girlfriend has observed the dressing change but not done any hands on.  If she comes in today will have RN have her do The dressing change.

## 2015-03-18 ENCOUNTER — Inpatient Hospital Stay (HOSPITAL_COMMUNITY): Payer: Self-pay | Admitting: Physical Therapy

## 2015-03-18 MED ORDER — ETODOLAC 200 MG PO CAPS: 200.0000 mg | ORAL_CAPSULE | Freq: Two times a day (BID) | ORAL | Status: DC

## 2015-03-18 MED ORDER — TIZANIDINE HCL 2 MG PO TABS: 2.0000 mg | ORAL_TABLET | Freq: Three times a day (TID) | ORAL | Status: DC | PRN

## 2015-03-18 MED ORDER — MUSCLE RUB 10-15 % EX CREA: 1.0000 "application " | TOPICAL_CREAM | Freq: Three times a day (TID) | CUTANEOUS | Status: DC

## 2015-03-18 MED ORDER — SENNOSIDES-DOCUSATE SODIUM 8.6-50 MG PO TABS: 2.0000 | ORAL_TABLET | Freq: Two times a day (BID) | ORAL | Status: DC

## 2015-03-18 MED ORDER — DULOXETINE HCL 40 MG PO CPEP: 40.0000 mg | ORAL_CAPSULE | Freq: Every day | ORAL | Status: DC

## 2015-03-18 MED ORDER — OXYCODONE HCL 5 MG PO TABS: 5.0000 mg | ORAL_TABLET | Freq: Two times a day (BID) | ORAL | Status: DC | PRN

## 2015-03-18 MED ORDER — COLLAGENASE 250 UNIT/GM EX OINT: TOPICAL_OINTMENT | Freq: Every day | CUTANEOUS | Status: DC

## 2015-03-18 MED ORDER — ACETAMINOPHEN 325 MG PO TABS: 325.0000 mg | ORAL_TABLET | ORAL | Status: AC | PRN

## 2015-03-18 MED ORDER — GABAPENTIN 300 MG PO CAPS: 300.0000 mg | ORAL_CAPSULE | Freq: Three times a day (TID) | ORAL | Status: DC

## 2015-03-18 NOTE — Discharge Instructions (Signed)
Inpatient Rehab Discharge Instructions  Craig Hoffman Discharge date and time:  03/18/15  Activities/Precautions/ Functional Status: Activity: No lifting, driving, or strenuous exercise till cleared by MD.  Diet: regular diet Wound Care: Apply santyl to neck wound. Then cover with barely damp gauze. Cover with dry dressing. Change every day.   Functional status:  ___ No restrictions     ___ Walk up steps independently ___ 24/7 supervision/assistance   ___ Walk up steps with assistance _X__ Intermittent supervision/assistance  ___ Bathe/dress independently ___ Walk with walker     ___ Bathe/dress with assistance ___ Walk Independently    ___ Shower independently ___ Walk with assistance    ___ Shower with assistance ___ No alcohol     ___ Return to work/school ________  Special Instructions: 1. You have to wear collar at all times.  2. Continue home exercise plan.   COMMUNITY REFERRALS UPON DISCHARGE:    Home Health:   RN  Agency:[facility] Phone:[phone]   Date of last service:03/18/2015  Medical Equipment/Items Ordered:CANE & TUB BENCH  Agency/Supplier:[facility]   [phone] Other:MEDICAID APPLICATION PENDING  OUTPATIENT COUNSELING:[facility]-[phone] DECLINED SUBSTANCE ABUSE RESOURCES  My questions have been answered and I understand these instructions. I will adhere to these goals and the provided educational materials after my discharge from the hospital.  Patient/Caregiver Signature _______________________________ Date __________  Clinician Signature _______________________________________ Date __________  Please bring this form and your medication list with you to all your follow-up doctor's appointments.

## 2015-03-18 NOTE — Progress Notes (Signed)
Knocked on patients door to see if he was awake for morning meds.  I heard patient jump back into bed over the bed railing.  When asked if he was up unassisted, he said "yes, I'm ready to go."  He refused all meds except for "his pills."  He stated he didn't want miralax, muscle rub, or the santyl cream applied.  Patient keeps saying "I'm ready to go, I'm packing up now."  Dani Gobbleeardon, Neil Brickell J, RN

## 2015-03-18 NOTE — Progress Notes (Signed)
On 03/17/15 the day shift nurse reported patient experienced a myoclonus episode. MD aware, orders given to decreased oxycodone to 5 mg. Continued to watch patient throughout shift. Patient with family support at bedside. Patient with left shoulder pain and discomfort. 2 mg Zanaflex and Oxy IR 5 mg given at HS. adm
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
## 2015-03-18 NOTE — Progress Notes (Signed)
Patient discharged home.  Left floor ambulatory with cane, escorted by nursing staff and family.  Patient verbalized discharge instructions as given by Marissa NestlePam Love, PA.  All patient belongings sent with patient.  Appears to be in no immediate distress at this time.  Dani Gobbleeardon, Attallah Ontko J, RN

## 2015-03-18 NOTE — Progress Notes (Signed)
Physical Therapy Session Note  Patient Details  Name: Craig Hoffman MRN: [id number] Date of Birth: 14-Jul-1995  Today's Date: 03/18/2015 PT Individual Time: 1000-1015 PT Individual Time Calculation (min): 15 min   Short Term Goals: Week 1:  PT Short Term Goal 1 (Week 1): Patient will be able to perform all bed mobility with Supervision.  PT Short Term Goal 1 - Progress (Week 1): Met PT Short Term Goal 2 (Week 1): Patient will be able to perform transfer supine to sit with Supervision PT Short Term Goal 2 - Progress (Week 1): Met PT Short Term Goal 3 (Week 1): Patient will be able to perform bed to w/c transfers with SBA PT Short Term Goal 3 - Progress (Week 1): Met PT Short Term Goal 4 (Week 1): Patient will be able to participate in gait on a distance of 100 feet with min A with LRAD PT Short Term Goal 4 - Progress (Week 1): Met  Skilled Therapeutic Interventions/Progress Updates:    Pt received in bed - difficult to arouse but with max encouragement participates in d/c assessment PT. Gait Training - Pt ambulates from room to/from gym with [place] mod I. Pt ambulates 10' over compliant mat surface mod I with SPC. Pt ended in bed, next to girlfriend. Pt safe to d/c home.   Therapy Documentation Precautions:  Precautions Precautions: Fall, Cervical Required Braces or Orthoses: Cervical Brace Cervical Brace: Hard collar, At all times Restrictions Weight Bearing Restrictions: No  Pain: Pt initially c/o 9/10 pain in B legs, then later denies all pain, stating he wants to go home.   See Function Navigator for Current Functional Status.   Therapy/Group: Individual Therapy  Craig Hoffman M 03/18/2015, 11:12 AM
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
## 2015-03-18 NOTE — Progress Notes (Signed)
[facility] PHYSICAL MEDICINE & REHABILITATION     PROGRESS NOTE    Subjective/Complaints:   One episode of spasm last noc  ROS: limited other than above.   Objective: Vital Signs: Blood pressure 142/89, pulse 60, temperature 99.3 F (37.4 C), temperature source Oral, resp. rate 18, height 6' (1.829 m), weight 70.761 kg (156 lb), SpO2 100 %. No results found.  Recent Labs  03/16/15 1000  WBC 4.9  HGB 14.7  HCT 42.9  PLT 237    Recent Labs  03/16/15 1000  NA 137  K 3.8  CL 99*  GLUCOSE 94  BUN 5*  CREATININE 0.99  CALCIUM 9.8   CBG (last 3)  No results for input(s): GLUCAP in the last 72 hours.  Wt Readings from Last 3 Encounters:  03/13/15 70.761 kg (156 lb) (54 %*, Z = 0.10)  03/01/15 72 kg (158 lb 11.7 oz) (58 %*, Z = 0.21)   * Growth percentiles are based on CDC 2-20 Years data.    Physical Exam:  BP 142/89 mmHg  Pulse 60  Temp(Src) 99.3 F (37.4 C) (Oral)  Resp 18  Ht 6' (1.829 m)  Wt 70.761 kg (156 lb)  BMI 21.15 kg/m2  SpO2 100%  General: Sleeping but awakens to voice MSK- no pain to palp RIght shoulder , Mild tenderness R trap Lungs clear Cor RRR Ext no C/C/E No evidence of clonus at ankles     Assessment/Plan: 1. 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, without spinal cord involvement  Stable for D/C today F/u PCP in 1-2 weeks F/u neurosurgery 1-2 weeks F/u psych 3-4 wks See D/C summary See D/C instructions Home health for dressing changes  Now mod I for ADL and mob Function:  Bathing Bathing position Bathing activity did not occur: Refused Position: Shower  Bathing parts Body parts bathed by patient: Right arm, Left arm, Chest, Abdomen, Front perineal area, Buttocks, Right upper leg, Left upper leg, Right lower leg, Left lower leg Body parts bathed by helper: Buttocks  Bathing assist Assist Level: More than reasonable time      Upper Body  Dressing/Undressing Upper body dressing   What is the patient wearing?: Pull over shirt/dress     Pull over shirt/dress - Perfomed by patient: Thread/unthread right sleeve, Thread/unthread left sleeve, Put head through opening, Pull shirt over trunk Pull over shirt/dress - Perfomed by helper: Put head through opening, Pull shirt over trunk        Upper body assist Assist Level: No help, No cues   Set up : To obtain clothing/put away  Lower Body Dressing/Undressing Lower body dressing   What is the patient wearing?: Socks, Underwear, Pants, Shoes Underwear - Performed by patient: Thread/unthread right underwear leg, Thread/unthread left underwear leg, Pull underwear up/down Underwear - Performed by helper: Thread/unthread right underwear leg, Thread/unthread left underwear leg, Pull underwear up/down Pants- Performed by patient: Thread/unthread right pants leg, Thread/unthread left pants leg, Pull pants up/down, Fasten/unfasten pants Pants- Performed by helper: Thread/unthread right pants leg, Thread/unthread left pants leg, Pull pants up/down Non-skid slipper socks- Performed by patient: Don/doff right sock, Don/doff left sock Non-skid slipper socks- Performed by helper: Don/doff right sock, Don/doff left sock Socks - Performed by patient: Don/doff right sock, Don/doff left sock   Shoes - Performed by patient: Don/doff right shoe, Don/doff left shoe            Lower body assist Assist for lower body  dressing: More than reasonable time      Toileting Toileting Toileting activity did not occur: No continent bowel/bladder event Toileting steps completed by patient: Adjust clothing prior to toileting, Performs perineal hygiene, Adjust clothing after toileting Toileting steps completed by helper: Performs perineal hygiene Toileting Assistive Devices: Grab bar or rail  Toileting assist Assist level: More than reasonable time   Transfers Chair/bed transfer   Chair/bed transfer  method: Ambulatory Chair/bed transfer assist level: No Help, no cues, assistive device, takes more than a reasonable amount of time Chair/bed transfer assistive device:  (none)     Locomotion Ambulation     Max distance: 150 Assist level: Supervision or verbal cues   Wheelchair Wheelchair activity did not occur: Safety/medical concerns Type: Manual      Cognition Comprehension Comprehension assist level: Follows complex conversation/direction with extra time/assistive device  Expression Expression assist level: Expresses basic needs/ideas: With no assist  Social Interaction Social Interaction assist level: Interacts appropriately 50 - 74% of the time - May be physically or verbally inappropriate.  Problem Solving Problem solving assist level: Solves basic 90% of the time/requires cueing < 10% of the time  Memory Memory assist level: Complete Independence: No helper    Medical Problem List and Plan: 1. 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 DVT Prophylaxis/Anticoagulation: Mechanical: Sequential compression devices, below knee Bilateral lower extremities, amb 1000' d/c lovenox 3. Pain Management:   -psych added gabapentin (increase to  tid today) and cymbalta (just increased to )     - D/C Lyrica   -Reduce  oxycodone to  q6 prn with wean schedule  -,.add NSAID             -myofascial pain R trap will add myoflex   3. Anxiety and Major Depression  -appreciate Psychiatry consult  -psychosocial issues playing a large role 4.  Hx of drug overdose, intentional, wean narcotics 5.  Myoclonus from serotonin syndrome, resolved may have some muscle soreness today, off tramadol cont cymbalta  LOS (Days) 12 A FACE TO FACE EVALUATION WAS PERFORMED  Craig Hoffman E 03/18/2015 7:01 AM

## 2015-03-19 ENCOUNTER — Encounter (HOSPITAL_COMMUNITY): Payer: Self-pay | Admitting: Vascular Surgery

## 2015-03-24 ENCOUNTER — Inpatient Hospital Stay: Payer: Self-pay | Admitting: Family Medicine

## 2015-04-01 ENCOUNTER — Encounter: Payer: Self-pay | Admitting: Family Medicine

## 2015-04-01 ENCOUNTER — Inpatient Hospital Stay: Payer: Self-pay | Admitting: Family Medicine

## 2015-04-01 ENCOUNTER — Ambulatory Visit: Payer: Medicaid Other | Attending: Family Medicine | Admitting: Family Medicine

## 2015-04-01 VITALS — BP 113/75 | HR 67 | Temp 98.6°F | Resp 16 | Ht 72.0 in | Wt 166.0 lb

## 2015-04-01 DIAGNOSIS — R200 Anesthesia of skin: Secondary | ICD-10-CM | POA: Insufficient documentation

## 2015-04-01 DIAGNOSIS — R531 Weakness: Secondary | ICD-10-CM | POA: Insufficient documentation

## 2015-04-01 DIAGNOSIS — W3400XD Accidental discharge from unspecified firearms or gun, subsequent encounter: Secondary | ICD-10-CM | POA: Diagnosis not present

## 2015-04-01 DIAGNOSIS — S1190XA Unspecified open wound of unspecified part of neck, initial encounter: Secondary | ICD-10-CM

## 2015-04-01 DIAGNOSIS — Z823 Family history of stroke: Secondary | ICD-10-CM | POA: Insufficient documentation

## 2015-04-01 DIAGNOSIS — S12300D Unspecified displaced fracture of fourth cervical vertebra, subsequent encounter for fracture with routine healing: Secondary | ICD-10-CM | POA: Diagnosis present

## 2015-04-01 DIAGNOSIS — F329 Major depressive disorder, single episode, unspecified: Secondary | ICD-10-CM | POA: Diagnosis not present

## 2015-04-01 DIAGNOSIS — W3400XA Accidental discharge from unspecified firearms or gun, initial encounter: Secondary | ICD-10-CM

## 2015-04-01 DIAGNOSIS — F332 Major depressive disorder, recurrent severe without psychotic features: Secondary | ICD-10-CM | POA: Diagnosis not present

## 2015-04-01 DIAGNOSIS — Z8249 Family history of ischemic heart disease and other diseases of the circulatory system: Secondary | ICD-10-CM | POA: Diagnosis not present

## 2015-04-01 DIAGNOSIS — R202 Paresthesia of skin: Secondary | ICD-10-CM | POA: Insufficient documentation

## 2015-04-01 DIAGNOSIS — Z79899 Other long term (current) drug therapy: Secondary | ICD-10-CM | POA: Diagnosis not present

## 2015-04-01 DIAGNOSIS — F319 Bipolar disorder, unspecified: Secondary | ICD-10-CM | POA: Diagnosis not present

## 2015-04-01 DIAGNOSIS — F17200 Nicotine dependence, unspecified, uncomplicated: Secondary | ICD-10-CM | POA: Diagnosis not present

## 2015-04-01 DIAGNOSIS — S1193XA Puncture wound without foreign body of unspecified part of neck, initial encounter: Secondary | ICD-10-CM

## 2015-04-01 MED ORDER — OXYCODONE HCL 5 MG PO TABS: 5.0000 mg | ORAL_TABLET | Freq: Two times a day (BID) | ORAL | Status: DC | PRN

## 2015-04-01 MED ORDER — TRAZODONE HCL 100 MG PO TABS: 100.0000 mg | ORAL_TABLET | Freq: Every day | ORAL | Status: AC
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
## 2015-04-07 ENCOUNTER — Inpatient Hospital Stay: Payer: Self-pay | Admitting: Family Medicine

## 2015-04-08 ENCOUNTER — Emergency Department (HOSPITAL_COMMUNITY)
Admission: EM | Admit: 2015-04-08 | Discharge: 2015-04-08 | Disposition: A | Discharge status: Home / Self Care | Payer: Medicaid Other | Attending: Emergency Medicine | Admitting: Emergency Medicine

## 2015-04-08 ENCOUNTER — Emergency Department (HOSPITAL_COMMUNITY): Payer: Medicaid Other

## 2015-04-08 ENCOUNTER — Encounter (HOSPITAL_COMMUNITY): Payer: Self-pay | Admitting: *Deleted

## 2015-04-08 DIAGNOSIS — F319 Bipolar disorder, unspecified: Secondary | ICD-10-CM | POA: Diagnosis not present

## 2015-04-08 DIAGNOSIS — R4020 Unspecified coma: Secondary | ICD-10-CM

## 2015-04-08 DIAGNOSIS — R55 Syncope and collapse: Secondary | ICD-10-CM | POA: Insufficient documentation

## 2015-04-08 DIAGNOSIS — Z791 Long term (current) use of non-steroidal anti-inflammatories (NSAID): Secondary | ICD-10-CM | POA: Diagnosis not present

## 2015-04-08 DIAGNOSIS — F17200 Nicotine dependence, unspecified, uncomplicated: Secondary | ICD-10-CM | POA: Insufficient documentation

## 2015-04-08 DIAGNOSIS — R51 Headache: Secondary | ICD-10-CM | POA: Diagnosis present

## 2015-04-08 DIAGNOSIS — Z79899 Other long term (current) drug therapy: Secondary | ICD-10-CM | POA: Insufficient documentation

## 2015-04-08 DIAGNOSIS — Z8619 Personal history of other infectious and parasitic diseases: Secondary | ICD-10-CM | POA: Insufficient documentation

## 2015-04-08 DIAGNOSIS — G253 Myoclonus: Secondary | ICD-10-CM | POA: Diagnosis not present

## 2015-04-08 DIAGNOSIS — F1420 Cocaine dependence, uncomplicated: Secondary | ICD-10-CM | POA: Insufficient documentation

## 2015-04-08 DIAGNOSIS — F1220 Cannabis dependence, uncomplicated: Secondary | ICD-10-CM

## 2015-04-08 LAB — URINALYSIS, ROUTINE W REFLEX MICROSCOPIC
BILIRUBIN URINE: NEGATIVE
Glucose, UA: NEGATIVE mg/dL
Hgb urine dipstick: NEGATIVE
Ketones, ur: NEGATIVE mg/dL
Leukocytes, UA: NEGATIVE
Nitrite: NEGATIVE
PH: 6 (ref 5.0–8.0)
Protein, ur: NEGATIVE mg/dL
SPECIFIC GRAVITY, URINE: 1.021 (ref 1.005–1.030)

## 2015-04-08 LAB — CBC WITH DIFFERENTIAL/PLATELET
BASOS ABS: 0 10*3/uL (ref 0.0–0.1)
BASOS PCT: 1 %
EOS PCT: 6 %
Eosinophils Absolute: 0.2 10*3/uL (ref 0.0–0.7)
HEMATOCRIT: 45.6 % (ref 39.0–52.0)
Hemoglobin: 15.3 g/dL (ref 13.0–17.0)
LYMPHS PCT: 44 %
Lymphs Abs: 1.5 10*3/uL (ref 0.7–4.0)
MCH: 29.1 pg (ref 26.0–34.0)
MCHC: 33.6 g/dL (ref 30.0–36.0)
MCV: 86.9 fL (ref 78.0–100.0)
MONO ABS: 0.4 10*3/uL (ref 0.1–1.0)
MONOS PCT: 10 %
NEUTROS ABS: 1.4 10*3/uL — AB (ref 1.7–7.7)
Neutrophils Relative %: 39 %
PLATELETS: 168 10*3/uL (ref 150–400)
RBC: 5.25 MIL/uL (ref 4.22–5.81)
RDW: 13.2 % (ref 11.5–15.5)
WBC: 3.5 10*3/uL — ABNORMAL LOW (ref 4.0–10.5)

## 2015-04-08 LAB — BASIC METABOLIC PANEL
Anion gap: 9 (ref 5–15)
BUN: 10 mg/dL (ref 6–20)
CALCIUM: 9.4 mg/dL (ref 8.9–10.3)
CO2: 26 mmol/L (ref 22–32)
CREATININE: 1.13 mg/dL (ref 0.61–1.24)
Chloride: 105 mmol/L (ref 101–111)
GFR calc Af Amer: >60 mL/min (ref >60)
GLUCOSE: 99 mg/dL (ref 65–99)
Potassium: 4.5 mmol/L (ref 3.5–5.1)
Sodium: 140 mmol/L (ref 135–145)

## 2015-04-08 LAB — I-STAT TROPONIN, ED: Troponin i, poc: 0 ng/mL (ref 0.00–0.08)

## 2015-04-08 MED ORDER — SODIUM CHLORIDE 0.9 % IV BOLUS (SEPSIS)
1000.0000 mL | Freq: Once | INTRAVENOUS | Status: AC
  Administered 2015-04-08: 1000 mL via INTRAVENOUS

## 2015-04-08 MED ORDER — PROCHLORPERAZINE EDISYLATE 5 MG/ML IJ SOLN
10.0000 mg | Freq: Once | INTRAMUSCULAR | Status: AC
  Administered 2015-04-08: 10 mg via INTRAVENOUS
  Filled 2015-04-08: qty 2

## 2015-04-08 NOTE — ED Notes (Signed)
Pt ambulated to restroom with standby assist. Pt used his cane without difficulty.
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
## 2015-04-08 NOTE — Discharge Instructions (Signed)
Do not drive, swim, or perform any activity that could be dangerous if you had a seizure while you are performing it. Do not take showers or baths without having someone close to you.   Follow closely with Neurology.  Do not hesitate to return to the emergency room for any new, worsening or concerning symptoms.  Please obtain primary care using resource guide below. Let them know that you were seen in the emergency room and that they will need to obtain records for further outpatient management.    Cannabis Use Disorder Cannabis use disorder is a mental disorder. It is not one-time or occasional use of cannabis, more commonly known as marijuana. Cannabis use disorder is the continued, nonmedical use of cannabis that interferes with normal life activities or causes health problems. People with cannabis use disorder get a feeling of extreme pleasure and relaxation from cannabis use. This "high" is very rewarding and causes people to use over and over.  The mind-altering ingredient in cannabis is know as THC. THC can also interfere with motor coordination, memory, judgment, and accurate sense of space and time. These effects can last for a few days after using cannabis. Regular heavy cannabis use can cause long-lasting problems with thinking and learning. In young people, these problems may be permanent. Cannabis sometimes causes severe anxiety, paranoia, or visual hallucinations. Man-made (synthetic) cannabis-like drugs, such as "spice" and "K2," cause the same effects as THC but are much stronger. Cannabis-like drugs can cause dangerously high blood pressure and heart rate.  Cannabis use disorder usually starts in the teenage years. It can trigger the development of schizophrenia. It is somewhat more common in men than women. People who have family members with the disorder or existing mental health issues such as depression and posttraumatic stress disorderare more likely to develop cannabis use  disorder. People with cannabis use disorder are at higher risk for use of other drugs of abuse.  SIGNS AND SYMPTOMS Signs and symptoms of cannabis use disorder include:   Use of cannabis in larger amounts or over a longer period than intended.   Unsuccessful attempts to cut down or control cannabis use.   A lot of time spent obtaining, using, or recovering from the effects of cannabis.   A strong desire or urge to use cannabis (cravings).   Continued use of cannabis in spite of problems at work, school, or home because of use.   Continued use of cannabis in spite of relationship problems because of use.  Giving up or cutting down on important life activities because of cannabis use.  Use of cannabis over and over even in situations when it is physically hazardous, such as when driving a car.   Continued use of cannabis in spite of a physical problem that is likely related to use. Physical problems can include:  Chronic cough.  Bronchitis.  Emphysema.  Throat and lung cancer.  Continued use of cannabis in spite of a mental problem that is likely related to use. Mental problems can include:  Psychosis.  Anxiety.  Difficulty sleeping.  Need to use more and more cannabis to get the same effect, or lessened effect over time with use of the same amount (tolerance).  Having withdrawal symptoms when cannabis use is stopped, or using cannabis to reduce or avoid withdrawal symptoms. Withdrawal symptoms include:  Irritability or anger.  Anxiety or restlessness.  Difficulty sleeping.  Loss of appetite or weight.  Aches and pains.  Shakiness.  Sweating.  Chills. DIAGNOSIS Cannabis  use disorder is diagnosed by your health care provider. You may be asked questions about your cannabis use and how it affects your life. A physical exam may be done. A drug screen may be done. You may be referred to a mental health professional. The diagnosis of cannabis use disorder  requires at least two symptoms within 12 months. The type of cannabis use disorder you have depends on the number of symptoms you have. The type may be:  Mild. Two or three signs and symptoms.   Moderate. Four or five signs and symptoms.   Severe. Six or more signs and symptoms.  TREATMENT Treatment is usually provided by mental health professionals with training in substance use disorders. The following options are available:  Counseling or talk therapy. Talk therapy addresses the reasons you use cannabis. It also addresses ways to keep you from using again. The goals of talk therapy include:  Identifying and avoiding triggers for use.  Learning how to handle cravings.  Replacing use with healthy activities.  Support groups. Support groups provide emotional support, advice, and guidance.  Medicine. Medicine is used to treat mental health issues that trigger cannabis use or that result from it. HOME CARE INSTRUCTIONS  Take medicines only as directed by your health care provider.  Check with your health care provider before starting any new medicines.  Keep all follow-up visits as directed by your health care provider. SEEK MEDICAL CARE IF:  You are not able to take your medicines as directed.  Your symptoms get worse. SEEK IMMEDIATE MEDICAL CARE IF: You have serious thoughts about hurting yourself or others. FOR MORE INFORMATION  National Institute on Drug Abuse: http://www.price-Lacap.com/  Substance Abuse and Mental Health Services Administration: SkateOasis.com.pt   This information is not intended to replace advice given to you by your health care provider. Make sure you discuss any questions you have with your health care provider.   Document Released: 04/29/2000 Document Revised: 05/23/2014 Document Reviewed: 05/15/2013 Elsevier Interactive Patient Education 2016 [employer].   Emergency Department Resource Guide 1) Find a Doctor and Pay Out of Pocket Although you won't  have to find out who is covered by your insurance plan, it is a good idea to ask around and get recommendations. You will then need to call the office and see if the doctor you have chosen will accept you as a new patient and what types of options they offer for patients who are self-pay. Some doctors offer discounts or will set up payment plans for their patients who do not have insurance, but you will need to ask so you aren't surprised when you get to your appointment.  2) Contact Your Local Health Department Not all health departments have doctors that can see patients for sick visits, but many do, so it is worth a call to see if yours does. If you don't know where your local health department is, you can check in your phone book. The CDC also has a tool to help you locate your state's health department, and many state websites also have listings of all of their local health departments.  3) Find a Walk-in Clinic If your illness is not likely to be very severe or complicated, you may want to try a walk in clinic. These are popping up all over the country in pharmacies, drugstores, and shopping centers. They're usually staffed by nurse practitioners or physician assistants that have been trained to treat common illnesses and complaints. They're usually fairly quick and inexpensive.  However, if you have serious medical issues or chronic medical problems, these are probably not your best option.  No Primary Care Doctor: - Call Health Connect at  [phone] - they can help you locate a primary care doctor that  accepts your insurance, provides certain services, etc. - Physician Referral Service- [phone]  Chronic Pain Problems: Organization         Address  Phone   Notes  [facility]  [phone] Patients need to be referred by their primary care doctor.   Medication Assistance: Organization         Address  Phone   Notes  [facility] Medication [facility]  [address] [place] [place], [place] [address] [phone] --Must be a resident of [facility] -- Must have NO insurance coverage whatsoever (no Medicaid/ Medicare, etc.) -- The pt. MUST have a primary care doctor that directs their care regularly and follows them in the community   [facility]  [phone]   [employer]  [phone]    Agencies that provide inexpensive medical care: Organization         Address  Phone   Notes  [facility]  [phone]   [facility]    [phone]   [facility] [address] [place], [place] [address] [phone]   [facility] [address], [place] [phone]   [facility]anned Parenthood    [phone]   [facility]    [phone]   [facility]  [address], [place] Phone:  [phone], Fax:  [phone] Hours of Operation:  9 am - 6 pm, M-F.  Also accepts Medicaid/Medicare and self-pay.  [facility]  [address], [place] Phone: [phone], Fax: [phone]. Hours of Operation:  8:30 am - 5:30 pm, M-F.  Also accepts Medicaid and self-pay.  [facility] [place] [address] [place] Phone: [phone]   [facility] [address] Natasha Bence [place], [place] [phone], Ext. [phone] Mondays & Thursdays: 7-9 AM.  First 15 patients are seen on a first come, first serve basis.    Medicaid-accepting [facility] Providers:  Organization         Address  Phone   Notes  [facility] [address], [place] [phone] Also accepts self-pay patients.  [facility] [address] Laurell Josephs [place], [place]  [phone]   [facility] [address], [place] [phone]   [facility] [address] [place] [phone]     Renaye Rakers [address], [place]   [phone] Only accepts [place] Access [place] patients after they have their name applied to their card.   Self-Pay (no insurance) in [facility]:  Organization         Address  Phone   Notes  Sickle Cell Patients, [facility] Internal Medicine [address] [place], [place] [phone]   [facility] Care [address] [place], [place] [phone]   [facility]  [address], [place] [phone]   [facility]/Dr. Osei-Bonsu  [address] [place] or [address], [place] [phone] Phone number for both  [place] and [place] locations is the same.  [facility]gent Medical and [facility] [address] [place] [phone]   [facility] [address], [place] or [address] [phone]   [facility] [address] [place] [phone], phone; [phone], fax Sees patients 1st and 3rd Saturday of every month.  Must not qualify for public or private insurance (i.e. Medicaid, Medicare, [place] Health Choice, Veterans' Benefits)  Household income should be no more than 200% of the poverty level The clinic cannot treat you if you are pregnant or think you are pregnant  Sexually transmitted diseases are not treated at the clinic.    Dental Care: Organization         Address  Phone  Notes  [facility] Department of [facility] [address] [place], [place] [phone] Accepts children up to age 77 who are enrolled in [place] or [place] Health Choice; pregnant women with a Medicaid card; and children who have applied for Medicaid or [place] Health Choice, but were declined, whose parents can pay a reduced fee at time of service.  [facility] Department of [facility]  [address], [place] [phone] Accepts children  up to age 88 who are enrolled in [place] or [place] Health Choice; pregnant women with a Medicaid card; and children who have applied for Medicaid or [place] Health Choice, but were declined, whose parents can pay a reduced fee at time of service.  [facility]  [address] [place], [place] [phone] Patients are seen by appointment only. Walk-ins are not accepted. [facility] will see patients 25 years of age and older. Monday - Tuesday (8am-5pm) Most Wednesdays (8:30-5pm) $30 per visit, cash only  [facility]  [address], [facility] [phone] Patients are seen by appointment only. Walk-ins are not accepted. [facility] will see patients 75 years of age and older. One Wednesday Evening (Monthly: Volunteer Based).  $30 per visit, cash only  [employer]  [phone] for adults; Children under age 9, call Graduate Pediatric Dentistry at [phone]. Children aged 64-14, please call [phone] to request a pediatric application.  Dental services are provided in all areas of dental care including fillings, crowns and bridges, complete and partial dentures, implants, gum treatment, root canals, and extractions. Preventive care is also provided. Treatment is provided to both adults and children. Patients are selected via a lottery and there is often a waiting list.   [facility] [address] [place]  [phone] [email or web address]   [facility] [address] [place], [place] [phone], Ext. [phone] Second and Fourth Thursday of each month, opens at 6:30 AM; Clinic ends at 9 AM.  Patients are seen on a first-come first-served basis, and a limited number are seen during each clinic.   [facility]  [address] Ether Griffins [place], [place] [phone]   Eligibility Requirements You must have lived in [place], [place], or [place] counties for at least the last  three months.   You cannot be eligible for state or federal sponsored [place], including [employer], [place], or [employer].   You generally cannot be eligible for healthcare insurance through your employer.    How to apply: Eligibility screenings are held every Tuesday and Wednesday afternoon from 1:00 pm until 4:00 pm.  You do not need an appointment for the interview!  [facility] [address] [place], [place] [phone]   [facility] Health Department  [phone]   [facility]  [phone]   [facility] Health Department  [phone]    Behavioral Health Resources in the Community: Intensive Outpatient Programs Organization         Address  Phone  Notes  [facility] [address], [place], [place] [phone]   [facility] [address], [place], [place] [phone]   [facility] [address] [place], [place]  [phone]   [facility] [address]  [place], [place] [phone] or [phone]   Substance Abuse Resources Organization         Address  Phone  Notes  [facility]  [phone]   [facility]  [phone]   [facility] [place]  [phone]   Floydene Flock  [phone]   Residential & Outpatient Substance Abuse Program  6691784458   Psychological Services Organization         Address  Phone  Notes  [facility] Health  [phone]   [facility] Services  [phone]   [facility] [address] [place] [phone] or [phone]    Mobile Crisis Teams Organization         Address  Phone  Notes  [facility], Mobile Crisis Care Unit  [phone]   [facility]  [address] [place], [place] [phone]   Doristine Locks [address] [place] [phone]     Self-Help/Support Groups Organization         Address  Phone             Notes  [facility] [place] - variety of support groups  336- I7437963 Call for more information  Narcotics Anonymous (NA), [facility] [address], [employer] [place]  2 meetings at this location   Statistician         Address  Phone  Notes  [facility] [address],    [place]  [phone]   [facility]  [address], [place] [phone], [place], [place] [phone]   [facility] [address] [place], [place] [phone] Admissions: 8am-3pm M-F  [facility] [address],    [place], [place] [phone]   [facility] [address] [place], [place], [place] [phone]   [facility] [address]  [place], [place] [phone]   [facility] - Intensive Outpatient [address] Laurell Josephs [address], [place], [place] [phone]   [facility] ([facility] [address]  [place], [place] [phone] or 3514272886   [facility] ([facility]) [address], [place], [place] [phone] Accepts Medicaid  [facility] [address],  [place] [phone] Substance Abuse/Addiction Treatment   [facility] Organization         Address  Phone  Notes  [facility]  [phone]   Angie Fava, PhD [address] Ervin Knack [place], [place]   4316102397 or [phone]   [facility] Behavioral   [address] [place], [place] [phone]   [facility] [address] [place], [place] [phone] Insurance/Medicaid/sponsorship through [employer] and Families [address]  [place], [place] [phone]) [phone] Therapy/tele-psych/case  [facility] [address]   [place], [place] [phone](336) [phone]    Dr. Lolly MustacheArfeen  [phone](336) [phone]   Free  [facility] [place]  [employer] [facility] 1) [address], [place] 2) [address], [place] 3)  [address] [place] [address], [place] [phone]) [phone]) [phone]  [phone](336) [phone]   [facility] Child Abuse Hotline (581) 203-5271(336) 986-044-3653 or [phone](336) [phone] (After Hours)

## 2015-04-09 LAB — RPR: RPR: NONREACTIVE

## 2015-04-09 LAB — HIV ANTIBODY (ROUTINE TESTING W REFLEX): HIV Screen 4th Generation wRfx: NONREACTIVE

## 2015-04-13 ENCOUNTER — Ambulatory Visit: Payer: Self-pay | Admitting: Family Medicine

## 2015-04-14 ENCOUNTER — Ambulatory Visit: Payer: Self-pay | Admitting: Family Medicine

## 2015-05-19 ENCOUNTER — Telehealth: Payer: Self-pay | Admitting: Family Medicine

## 2015-05-19 MED ORDER — GABAPENTIN 300 MG PO CAPS: 300.0000 mg | ORAL_CAPSULE | Freq: Three times a day (TID) | ORAL | Status: DC

## 2015-05-19 NOTE — Telephone Encounter (Signed)
Medication Refill: oxyCODONE (OXY IR/ROXICODONE) 5 MG immediate release tablet, gabapentin (NEURONTIN) 300 MG capsule  Pt stating he needs refills on all his medications, he does not know the name of all the rest.  Please call pt.
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
## 2015-05-29 ENCOUNTER — Ambulatory Visit: Payer: Self-pay | Admitting: Family Medicine

## 2015-06-03 ENCOUNTER — Encounter (HOSPITAL_COMMUNITY): Payer: Self-pay | Admitting: *Deleted

## 2015-06-03 ENCOUNTER — Emergency Department (HOSPITAL_COMMUNITY)
Admission: EM | Admit: 2015-06-03 | Discharge: 2015-06-03 | Disposition: A | Discharge status: Home / Self Care | Payer: Medicaid Other | Attending: Emergency Medicine | Admitting: Emergency Medicine

## 2015-06-03 DIAGNOSIS — R52 Pain, unspecified: Secondary | ICD-10-CM

## 2015-06-03 DIAGNOSIS — J069 Acute upper respiratory infection, unspecified: Secondary | ICD-10-CM | POA: Insufficient documentation

## 2015-06-03 DIAGNOSIS — Y9389 Activity, other specified: Secondary | ICD-10-CM | POA: Insufficient documentation

## 2015-06-03 DIAGNOSIS — S12390A Other displaced fracture of fourth cervical vertebra, initial encounter for closed fracture: Secondary | ICD-10-CM | POA: Diagnosis not present

## 2015-06-03 DIAGNOSIS — Z8619 Personal history of other infectious and parasitic diseases: Secondary | ICD-10-CM | POA: Insufficient documentation

## 2015-06-03 DIAGNOSIS — Y998 Other external cause status: Secondary | ICD-10-CM | POA: Insufficient documentation

## 2015-06-03 DIAGNOSIS — F319 Bipolar disorder, unspecified: Secondary | ICD-10-CM | POA: Diagnosis not present

## 2015-06-03 DIAGNOSIS — R509 Fever, unspecified: Secondary | ICD-10-CM | POA: Diagnosis present

## 2015-06-03 DIAGNOSIS — F17200 Nicotine dependence, unspecified, uncomplicated: Secondary | ICD-10-CM | POA: Diagnosis not present

## 2015-06-03 DIAGNOSIS — W1839XA Other fall on same level, initial encounter: Secondary | ICD-10-CM | POA: Insufficient documentation

## 2015-06-03 DIAGNOSIS — Y92410 Unspecified street and highway as the place of occurrence of the external cause: Secondary | ICD-10-CM | POA: Insufficient documentation

## 2015-06-03 DIAGNOSIS — Z79899 Other long term (current) drug therapy: Secondary | ICD-10-CM | POA: Insufficient documentation

## 2015-06-03 DIAGNOSIS — S12300A Unspecified displaced fracture of fourth cervical vertebra, initial encounter for closed fracture: Secondary | ICD-10-CM

## 2015-06-03 LAB — BASIC METABOLIC PANEL
ANION GAP: 8 (ref 5–15)
BUN: 6 mg/dL (ref 6–20)
CHLORIDE: 105 mmol/L (ref 101–111)
CO2: 26 mmol/L (ref 22–32)
Calcium: 9.2 mg/dL (ref 8.9–10.3)
Creatinine, Ser: 1.08 mg/dL (ref 0.61–1.24)
GFR calc non Af Amer: >60 mL/min (ref >60)
GLUCOSE: 112 mg/dL — AB (ref 65–99)
POTASSIUM: 3.6 mmol/L (ref 3.5–5.1)
Sodium: 139 mmol/L (ref 135–145)

## 2015-06-03 LAB — CBC
HEMATOCRIT: 43.7 % (ref 39.0–52.0)
Hemoglobin: 14.9 g/dL (ref 13.0–17.0)
MCH: 28.8 pg (ref 26.0–34.0)
MCHC: 34.1 g/dL (ref 30.0–36.0)
MCV: 84.4 fL (ref 78.0–100.0)
Platelets: 146 10*3/uL — ABNORMAL LOW (ref 150–400)
RBC: 5.18 MIL/uL (ref 4.22–5.81)
RDW: 12.9 % (ref 11.5–15.5)
WBC: 7.6 10*3/uL (ref 4.0–10.5)

## 2015-06-03 LAB — RAPID STREP SCREEN (MED CTR MEBANE ONLY): STREPTOCOCCUS, GROUP A SCREEN (DIRECT): NEGATIVE
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
## 2015-06-03 NOTE — Discharge Instructions (Signed)
Return to the ED with any concerns including weakness in arms or legs, ongoing fever, vomiting and not able to keep down liquids, decreased level of alertness/lethargy, or any other alarming symptoms
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
## 2015-06-04 LAB — HIV ANTIBODY (ROUTINE TESTING W REFLEX): HIV SCREEN 4TH GENERATION: NONREACTIVE

## 2015-06-04 LAB — CULTURE, GROUP A STREP (THRC)

## 2015-06-04 LAB — GC/CHLAMYDIA PROBE AMP (~~LOC~~) NOT AT ARMC
Chlamydia: NEGATIVE
NEISSERIA GONORRHEA: NEGATIVE

## 2015-06-04 LAB — RPR: RPR: NONREACTIVE

## 2015-08-14 ENCOUNTER — Ambulatory Visit: Payer: Self-pay | Admitting: Family Medicine

## 2015-11-17 ENCOUNTER — Encounter (HOSPITAL_COMMUNITY): Payer: Self-pay | Admitting: *Deleted

## 2015-11-17 ENCOUNTER — Emergency Department (HOSPITAL_COMMUNITY)
Admission: EM | Admit: 2015-11-17 | Discharge: 2015-11-17 | Disposition: A | Discharge status: Home / Self Care | Payer: No Typology Code available for payment source | Attending: Dermatology | Admitting: Dermatology

## 2015-11-17 DIAGNOSIS — K920 Hematemesis: Secondary | ICD-10-CM | POA: Diagnosis present

## 2015-11-17 DIAGNOSIS — Z5321 Procedure and treatment not carried out due to patient leaving prior to being seen by health care provider: Secondary | ICD-10-CM | POA: Insufficient documentation

## 2015-11-17 LAB — CBC WITH DIFFERENTIAL/PLATELET
BASOS ABS: 0 10*3/uL (ref 0.0–0.1)
BASOS PCT: 1 %
Eosinophils Absolute: 0.1 10*3/uL (ref 0.0–0.7)
Eosinophils Relative: 2 %
HEMATOCRIT: 48.4 % (ref 39.0–52.0)
HEMOGLOBIN: 16.6 g/dL (ref 13.0–17.0)
Lymphocytes Relative: 40 %
Lymphs Abs: 1.6 10*3/uL (ref 0.7–4.0)
MCH: 28.6 pg (ref 26.0–34.0)
MCHC: 34.3 g/dL (ref 30.0–36.0)
MCV: 83.3 fL (ref 78.0–100.0)
Monocytes Absolute: 0.3 10*3/uL (ref 0.1–1.0)
Monocytes Relative: 8 %
NEUTROS ABS: 2.1 10*3/uL (ref 1.7–7.7)
NEUTROS PCT: 49 %
Platelets: 198 10*3/uL (ref 150–400)
RBC: 5.81 MIL/uL (ref 4.22–5.81)
RDW: 12.6 % (ref 11.5–15.5)
WBC: 4.1 10*3/uL (ref 4.0–10.5)

## 2015-11-17 LAB — URINALYSIS, ROUTINE W REFLEX MICROSCOPIC
Glucose, UA: NEGATIVE mg/dL
Hgb urine dipstick: NEGATIVE
Ketones, ur: 15 mg/dL — AB
Leukocytes, UA: NEGATIVE
NITRITE: NEGATIVE
PH: 6 (ref 5.0–8.0)
PROTEIN: NEGATIVE mg/dL
Specific Gravity, Urine: 1.036 — ABNORMAL HIGH (ref 1.005–1.030)

## 2015-11-17 LAB — BASIC METABOLIC PANEL
ANION GAP: 8 (ref 5–15)
BUN: 13 mg/dL (ref 6–20)
CALCIUM: 9.3 mg/dL (ref 8.9–10.3)
CO2: 26 mmol/L (ref 22–32)
Chloride: 101 mmol/L (ref 101–111)
Creatinine, Ser: 1.29 mg/dL — ABNORMAL HIGH (ref 0.61–1.24)
GFR calc non Af Amer: >60 mL/min (ref >60)
Glucose, Bld: 95 mg/dL (ref 65–99)
Potassium: 4.4 mmol/L (ref 3.5–5.1)
Sodium: 135 mmol/L (ref 135–145)
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
## 2015-11-17 NOTE — ED Notes (Signed)
Pts name called for a room no answer 

## 2015-11-18 ENCOUNTER — Encounter (HOSPITAL_COMMUNITY): Payer: Self-pay | Admitting: Emergency Medicine

## 2015-11-18 ENCOUNTER — Emergency Department (HOSPITAL_COMMUNITY)
Admission: EM | Admit: 2015-11-18 | Discharge: 2015-11-18 | Disposition: A | Discharge status: Home / Self Care | Payer: Medicaid Other | Attending: Emergency Medicine | Admitting: Emergency Medicine

## 2015-11-18 DIAGNOSIS — G8929 Other chronic pain: Secondary | ICD-10-CM | POA: Insufficient documentation

## 2015-11-18 DIAGNOSIS — Z79899 Other long term (current) drug therapy: Secondary | ICD-10-CM | POA: Insufficient documentation

## 2015-11-18 DIAGNOSIS — E860 Dehydration: Secondary | ICD-10-CM | POA: Diagnosis not present

## 2015-11-18 DIAGNOSIS — F17200 Nicotine dependence, unspecified, uncomplicated: Secondary | ICD-10-CM | POA: Insufficient documentation

## 2015-11-18 DIAGNOSIS — R112 Nausea with vomiting, unspecified: Secondary | ICD-10-CM | POA: Insufficient documentation

## 2015-11-18 LAB — CBC
HEMATOCRIT: 43.7 % (ref 39.0–52.0)
Hemoglobin: 15.3 g/dL (ref 13.0–17.0)
MCH: 28.8 pg (ref 26.0–34.0)
MCHC: 35 g/dL (ref 30.0–36.0)
MCV: 82.1 fL (ref 78.0–100.0)
Platelets: 195 10*3/uL (ref 150–400)
RBC: 5.32 MIL/uL (ref 4.22–5.81)
RDW: 12.6 % (ref 11.5–15.5)
WBC: 4.5 10*3/uL (ref 4.0–10.5)

## 2015-11-18 LAB — COMPREHENSIVE METABOLIC PANEL
ALBUMIN: 4 g/dL (ref 3.5–5.0)
ALT: 24 U/L (ref 17–63)
AST: 23 U/L (ref 15–41)
Alkaline Phosphatase: 40 U/L (ref 38–126)
Anion gap: 9 (ref 5–15)
BILIRUBIN TOTAL: 0.6 mg/dL (ref 0.3–1.2)
BUN: 10 mg/dL (ref 6–20)
CHLORIDE: 101 mmol/L (ref 101–111)
CO2: 25 mmol/L (ref 22–32)
CREATININE: 1.11 mg/dL (ref 0.61–1.24)
Calcium: 9.4 mg/dL (ref 8.9–10.3)
GFR calc Af Amer: >60 mL/min (ref >60)
GLUCOSE: 92 mg/dL (ref 65–99)
Potassium: 3.8 mmol/L (ref 3.5–5.1)
Sodium: 135 mmol/L (ref 135–145)
TOTAL PROTEIN: 7 g/dL (ref 6.5–8.1)

## 2015-11-18 LAB — URINALYSIS, ROUTINE W REFLEX MICROSCOPIC
Bilirubin Urine: NEGATIVE
GLUCOSE, UA: NEGATIVE mg/dL
Hgb urine dipstick: NEGATIVE
Ketones, ur: 15 mg/dL — AB
LEUKOCYTES UA: NEGATIVE
NITRITE: NEGATIVE
PH: 6 (ref 5.0–8.0)
Protein, ur: NEGATIVE mg/dL
SPECIFIC GRAVITY, URINE: 1.027 (ref 1.005–1.030)

## 2015-11-18 LAB — LIPASE, BLOOD: Lipase: 28 U/L (ref 11–51)

## 2015-11-18 MED ORDER — ONDANSETRON HCL 4 MG/2ML IJ SOLN
4.0000 mg | Freq: Once | INTRAMUSCULAR | Status: AC
  Administered 2015-11-18: 4 mg via INTRAVENOUS
  Filled 2015-11-18: qty 2

## 2015-11-18 MED ORDER — MORPHINE SULFATE (PF) 4 MG/ML IV SOLN
4.0000 mg | Freq: Once | INTRAVENOUS | Status: AC
  Administered 2015-11-18: 4 mg via INTRAVENOUS
  Filled 2015-11-18: qty 1

## 2015-11-18 MED ORDER — ETODOLAC 200 MG PO CAPS: 200.0000 mg | ORAL_CAPSULE | Freq: Two times a day (BID) | ORAL | Status: DC

## 2015-11-18 MED ORDER — SODIUM CHLORIDE 0.9 % IV BOLUS (SEPSIS)
1000.0000 mL | Freq: Once | INTRAVENOUS | Status: AC
  Administered 2015-11-18: 1000 mL via INTRAVENOUS

## 2015-11-18 MED ORDER — GABAPENTIN 300 MG PO CAPS: 300.0000 mg | ORAL_CAPSULE | Freq: Three times a day (TID) | ORAL | Status: DC

## 2015-11-18 MED ORDER — TIZANIDINE HCL 2 MG PO TABS: 2.0000 mg | ORAL_TABLET | Freq: Three times a day (TID) | ORAL | Status: DC | PRN

## 2015-11-18 MED ORDER — PROMETHAZINE HCL 25 MG PO TABS: 25.0000 mg | ORAL_TABLET | Freq: Four times a day (QID) | ORAL | Status: AC | PRN

## 2015-11-18 MED ORDER — DULOXETINE HCL 40 MG PO CPEP: 40.0000 mg | ORAL_CAPSULE | Freq: Every day | ORAL | Status: AC

## 2015-11-18 NOTE — Discharge Instructions (Signed)
Dehydration, Adult Dehydration is a condition in which you do not have enough fluid or water in your body. It happens when you take in less fluid than you lose. Vital organs such as the kidneys, brain, and heart cannot function without a proper amount of fluids. Any loss of fluids from the body can cause dehydration.  Dehydration can range from mild to severe. This condition should be treated right away to help prevent it from becoming severe. CAUSES  This condition may be caused by:  Vomiting.  Diarrhea.  Excessive sweating, such as when exercising in hot or humid weather.  Not drinking enough fluid during strenuous exercise or during an illness.  Excessive urine output.  Fever.  Certain medicines. RISK FACTORS This condition is more likely to develop in:  People who are taking certain medicines that cause the body to lose excess fluid (diuretics).   People who have a chronic illness, such as diabetes, that may increase urination.  Older adults.   People who live at high altitudes.   People who participate in endurance sports.  SYMPTOMS  Mild Dehydration  Thirst.  Dry lips.  Slightly dry mouth.  Dry, warm skin. Moderate Dehydration  Very dry mouth.   Muscle cramps.   Dark urine and decreased urine production.   Decreased tear production.   Headache.   Light-headedness, especially when you stand up from a sitting position.  Severe Dehydration  Changes in skin.   Cold and clammy skin.   Skin does not spring back quickly when lightly pinched and released.   Changes in body fluids.   Extreme thirst.   No tears.   Not able to sweat when body temperature is high, such as in hot weather.   Minimal urine production.   Changes in vital signs.   Rapid, weak pulse (more than 100 beats per minute when you are sitting still).   Rapid breathing.   Low blood pressure.   Other changes.   Sunken eyes.   Cold hands and feet.    Confusion.  Lethargy and difficulty being awakened.  Fainting (syncope).   Short-term weight loss.   Unconsciousness. DIAGNOSIS  This condition may be diagnosed based on your symptoms. You may also have tests to determine how severe your dehydration is. These tests may include:   Urine tests.   Blood tests.  TREATMENT  Treatment for this condition depends on the severity. Mild or moderate dehydration can often be treated at home. Treatment should be started right away. Do not wait until dehydration becomes severe. Severe dehydration needs to be treated at the hospital. Treatment for Mild Dehydration  Drinking plenty of water to replace the fluid you have lost.   Replacing minerals in your blood (electrolytes) that you may have lost.  Treatment for Moderate Dehydration  Consuming oral rehydration solution (ORS). Treatment for Severe Dehydration  Receiving fluid through an IV tube.   Receiving electrolyte solution through a feeding tube that is passed through your nose and into your stomach (nasogastric tube or NG tube).  Correcting any abnormalities in electrolytes. HOME CARE INSTRUCTIONS   Drink enough fluid to keep your urine clear or pale yellow.   Drink water or fluid slowly by taking small sips. You can also try sucking on ice cubes.  Have food or beverages that contain electrolytes. Examples include bananas and sports drinks.  Take over-the-counter and prescription medicines only as told by your health care provider.   Prepare ORS according to the manufacturer's instructions. Take sips   of ORS every 5 minutes until your urine returns to normal.  If you have vomiting or diarrhea, continue to try to drink water, ORS, or both.   If you have diarrhea, avoid:   Beverages that contain caffeine.   Fruit juice.   Milk.   Carbonated soft drinks.  Do not take salt tablets. This can lead to the condition of having too much sodium in your body  (hypernatremia).  SEEK MEDICAL CARE IF:  You cannot eat or drink without vomiting.  You have had moderate diarrhea during a period of more than 24 hours.  You have a fever. SEEK IMMEDIATE MEDICAL CARE IF:   You have extreme thirst.  You have severe diarrhea.  You have not urinated in 6-8 hours, or you have urinated only a small amount of very dark urine.  You have shriveled skin.  You are dizzy, confused, or both.   This information is not intended to replace advice given to you by your health care provider. Make sure you discuss any questions you have with your health care provider.   Document Released: 05/02/2005 Document Revised: 01/21/2015 Document Reviewed: 09/17/2014 Elsevier Interactive Patient Education 2016 Elsevier Inc[employer]    Nausea and Vomiting Nausea is a sick feeling that often comes before throwing up (vomiting). Vomiting is a reflex where stomach contents come out of your mouth. Vomiting can cause severe loss of body fluids (dehydration). Children and elderly adults can become dehydrated quickly, especially if they also have diarrhea. Nausea and vomiting are symptoms of a condition or disease. It is important to find the cause of your symptoms. CAUSES   Direct irritation of the stomach lining. This irritation can result from increased acid production (gastroesophageal reflux disease), infection, food poisoning, taking certain medicines (such as nonsteroidal anti-inflammatory drugs), alcohol use, or tobacco use.  Signals from the brain.These signals could be caused by a headache, heat exposure, an inner ear disturbance, increased pressure in the brain from injury, infection, a tumor, or a concussion, pain, emotional stimulus, or metabolic problems.  An obstruction in the gastrointestinal tract (bowel obstruction).  Illnesses such as diabetes, hepatitis, gallbladder problems, appendicitis, kidney problems, cancer, sepsis, atypical symptoms of a heart attack, or  eating disorders.  Medical treatments such as chemotherapy and radiation.  Receiving medicine that makes you sleep (general anesthetic) during surgery. DIAGNOSIS Your caregiver may ask for tests to be done if the problems do not improve after a few days. Tests may also be done if symptoms are severe or if the reason for the nausea and vomiting is not clear. Tests may include:  Urine tests.  Blood tests.  Stool tests.  Cultures (to look for evidence of infection).  X-rays or other imaging studies. Test results can help your caregiver make decisions about treatment or the need for additional tests. TREATMENT You need to stay well hydrated. Drink frequently but in small amounts.You may wish to drink water, sports drinks, clear broth, or eat frozen ice pops or gelatin dessert to help stay hydrated.When you eat, eating slowly may help prevent nausea.There are also some antinausea medicines that may help prevent nausea. HOME CARE INSTRUCTIONS   Take all medicine as directed by your caregiver.  If you do not have an appetite, do not force yourself to eat. However, you must continue to drink fluids.  If you have an appetite, eat a normal diet unless your caregiver tells you differently.  Eat a variety of complex carbohydrates (rice, wheat, potatoes, bread), lean meats, yogurt, fruits,   and vegetables.  Avoid high-fat foods because they are more difficult to digest.  Drink enough water and fluids to keep your urine clear or pale yellow.  If you are dehydrated, ask your caregiver for specific rehydration instructions. Signs of dehydration may include:  Severe thirst.  Dry lips and mouth.  Dizziness.  Dark urine.  Decreasing urine frequency and amount.  Confusion.  Rapid breathing or pulse. SEEK IMMEDIATE MEDICAL CARE IF:   You have blood or brown flecks (like coffee grounds) in your vomit.  You have black or bloody stools.  You have a severe headache or stiff  neck.  You are confused.  You have severe abdominal pain.  You have chest pain or trouble breathing.  You do not urinate at least once every 8 hours.  You develop cold or clammy skin.  You continue to vomit for longer than 24 to 48 hours.  You have a fever. MAKE SURE YOU:   Understand these instructions.  Will watch your condition.  Will get help right away if you are not doing well or get worse.   This information is not intended to replace advice given to you by your health care provider. Make sure you discuss any questions you have with your health care provider.   Document Released: 05/02/2005 Document Revised: 07/25/2011 Document Reviewed: 09/29/2010 Elsevier Interactive Patient Education 2016 Elsevier Inc.  

## 2015-11-18 NOTE — ED Notes (Signed)
Patient endorses abdominal pain associated with hematemesis x2 over the past two days.  Also states he has pain in his neck and has aspen collar on that patient applied at home.  Walked independently into department and is A&Ox4.
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
## 2015-11-18 NOTE — ED Notes (Signed)
Pt. reports hematemesis onset 2 days ago with generalized abdominal pain , denies fever or diarrhea .

## 2016-01-13 ENCOUNTER — Emergency Department (HOSPITAL_COMMUNITY)
Admission: EM | Admit: 2016-01-13 | Discharge: 2016-01-14 | Disposition: A | Discharge status: Home / Self Care | Payer: Medicaid Other | Attending: Emergency Medicine | Admitting: Emergency Medicine

## 2016-01-13 ENCOUNTER — Encounter (HOSPITAL_COMMUNITY): Payer: Self-pay | Admitting: Emergency Medicine

## 2016-01-13 ENCOUNTER — Emergency Department (HOSPITAL_COMMUNITY): Payer: Medicaid Other

## 2016-01-13 DIAGNOSIS — H53149 Visual discomfort, unspecified: Secondary | ICD-10-CM | POA: Insufficient documentation

## 2016-01-13 DIAGNOSIS — R569 Unspecified convulsions: Secondary | ICD-10-CM

## 2016-01-13 DIAGNOSIS — Z79899 Other long term (current) drug therapy: Secondary | ICD-10-CM | POA: Diagnosis not present

## 2016-01-13 DIAGNOSIS — M549 Dorsalgia, unspecified: Secondary | ICD-10-CM | POA: Diagnosis not present

## 2016-01-13 DIAGNOSIS — F10220 Alcohol dependence with intoxication, uncomplicated: Secondary | ICD-10-CM | POA: Insufficient documentation

## 2016-01-13 DIAGNOSIS — R51 Headache: Secondary | ICD-10-CM | POA: Insufficient documentation

## 2016-01-13 DIAGNOSIS — IMO0002 Reserved for concepts with insufficient information to code with codable children: Secondary | ICD-10-CM

## 2016-01-13 DIAGNOSIS — M542 Cervicalgia: Secondary | ICD-10-CM | POA: Insufficient documentation

## 2016-01-13 DIAGNOSIS — F17210 Nicotine dependence, cigarettes, uncomplicated: Secondary | ICD-10-CM | POA: Diagnosis not present

## 2016-01-13 HISTORY — DX: Unspecified convulsions: R56.9

## 2016-01-13 HISTORY — DX: Schizophrenia, unspecified: F20.9

## 2016-01-13 LAB — CBC WITH DIFFERENTIAL/PLATELET
BASOS ABS: 0 10*3/uL (ref 0.0–0.1)
BASOS PCT: 0 %
Eosinophils Absolute: 0.3 10*3/uL (ref 0.0–0.7)
Eosinophils Relative: 5 %
HEMATOCRIT: 42 % (ref 39.0–52.0)
Hemoglobin: 14.6 g/dL (ref 13.0–17.0)
LYMPHS PCT: 45 %
Lymphs Abs: 2.2 10*3/uL (ref 0.7–4.0)
MCH: 28.7 pg (ref 26.0–34.0)
MCHC: 34.8 g/dL (ref 30.0–36.0)
MCV: 82.5 fL (ref 78.0–100.0)
MONO ABS: 0.3 10*3/uL (ref 0.1–1.0)
Monocytes Relative: 7 %
NEUTROS ABS: 2.1 10*3/uL (ref 1.7–7.7)
NEUTROS PCT: 43 %
Platelets: 169 10*3/uL (ref 150–400)
RBC: 5.09 MIL/uL (ref 4.22–5.81)
RDW: 13.1 % (ref 11.5–15.5)
WBC: 4.9 10*3/uL (ref 4.0–10.5)

## 2016-01-13 LAB — CBG MONITORING, ED: Glucose-Capillary: 118 mg/dL — ABNORMAL HIGH (ref 65–99)

## 2016-01-13 MED ORDER — SODIUM CHLORIDE 0.9 % IV BOLUS (SEPSIS)
1000.0000 mL | Freq: Once | INTRAVENOUS | Status: AC
  Administered 2016-01-13: 1000 mL via INTRAVENOUS

## 2016-01-13 MED ORDER — DIPHENHYDRAMINE HCL 50 MG/ML IJ SOLN
25.0000 mg | Freq: Once | INTRAMUSCULAR | Status: AC
  Administered 2016-01-13: 25 mg via INTRAVENOUS
  Filled 2016-01-13: qty 1

## 2016-01-13 MED ORDER — METOCLOPRAMIDE HCL 5 MG/ML IJ SOLN
10.0000 mg | Freq: Once | INTRAMUSCULAR | Status: AC
  Administered 2016-01-13: 10 mg via INTRAVENOUS
  Filled 2016-01-13: qty 2

## 2016-01-13 MED ORDER — KETOROLAC TROMETHAMINE 30 MG/ML IJ SOLN
30.0000 mg | Freq: Once | INTRAMUSCULAR | Status: AC
  Administered 2016-01-13: 30 mg via INTRAVENOUS
  Filled 2016-01-13: qty 1
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
## 2016-01-13 NOTE — ED Triage Notes (Signed)
Pt states he had a seizure yesterday.

## 2016-01-13 NOTE — ED Notes (Signed)
Bed: WA24 Expected date:  Expected time:  Means of arrival:  Comments: seizures 
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
## 2016-01-14 LAB — BASIC METABOLIC PANEL
Anion gap: 9 (ref 5–15)
BUN: 23 mg/dL — AB (ref 6–20)
CALCIUM: 9.2 mg/dL (ref 8.9–10.3)
CO2: 24 mmol/L (ref 22–32)
Chloride: 105 mmol/L (ref 101–111)
Creatinine, Ser: 1.03 mg/dL (ref 0.61–1.24)
GFR calc Af Amer: >60 mL/min (ref >60)
Glucose, Bld: 107 mg/dL — ABNORMAL HIGH (ref 65–99)
POTASSIUM: 3.8 mmol/L (ref 3.5–5.1)
SODIUM: 138 mmol/L (ref 135–145)

## 2016-01-14 LAB — URINALYSIS, ROUTINE W REFLEX MICROSCOPIC
Bilirubin Urine: NEGATIVE
Glucose, UA: NEGATIVE mg/dL
HGB URINE DIPSTICK: NEGATIVE
Ketones, ur: NEGATIVE mg/dL
LEUKOCYTES UA: NEGATIVE
NITRITE: NEGATIVE
Protein, ur: NEGATIVE mg/dL
SPECIFIC GRAVITY, URINE: 1.022 (ref 1.005–1.030)
pH: 6 (ref 5.0–8.0)

## 2016-01-14 LAB — RAPID URINE DRUG SCREEN, HOSP PERFORMED
Amphetamines: NOT DETECTED
BENZODIAZEPINES: NOT DETECTED
Barbiturates: NOT DETECTED
Cocaine: NOT DETECTED
Opiates: POSITIVE — AB
Tetrahydrocannabinol: POSITIVE — AB

## 2016-01-14 LAB — ETHANOL: ALCOHOL ETHYL (B): 66 mg/dL — AB (ref <5)

## 2016-01-14 LAB — HIV ANTIBODY (ROUTINE TESTING W REFLEX): HIV SCREEN 4TH GENERATION: NONREACTIVE

## 2016-01-14 MED ORDER — DEXAMETHASONE SODIUM PHOSPHATE 10 MG/ML IJ SOLN
10.0000 mg | Freq: Once | INTRAMUSCULAR | Status: AC
  Administered 2016-01-14: 10 mg via INTRAVENOUS
  Filled 2016-01-14: qty 1

## 2016-01-14 NOTE — ED Notes (Signed)
Pt ambulatory and independent at discharge.  Verbalized understanding of discharge instructions 

## 2016-01-19 ENCOUNTER — Ambulatory Visit: Payer: Medicaid Other | Admitting: Neurology

## 2016-01-20 ENCOUNTER — Ambulatory Visit: Payer: Medicaid Other | Admitting: Neurology

## 2016-01-21 ENCOUNTER — Encounter: Payer: Self-pay | Admitting: Neurology

## 2016-02-18 ENCOUNTER — Ambulatory Visit: Payer: Medicaid Other | Admitting: Neurology

## 2016-02-18 ENCOUNTER — Telehealth: Payer: Self-pay

## 2016-02-18 NOTE — Telephone Encounter (Signed)
PATIENT NO SHOW FOR APPT. THIS IS HIS SECOND NO SHOW.

## 2016-02-22 ENCOUNTER — Encounter: Payer: Self-pay | Admitting: Neurology

## 2016-02-23 ENCOUNTER — Encounter: Payer: Self-pay | Admitting: Neurology

## 2016-02-24 ENCOUNTER — Telehealth: Payer: Self-pay | Admitting: Neurology

## 2016-02-24 NOTE — Telephone Encounter (Signed)
Patient called to reschedule appointment, patient advised, we will be unable to schedule appointment due to 2 consecutive New Patient No Show appointments.

## 2016-05-14 ENCOUNTER — Emergency Department (HOSPITAL_COMMUNITY)
Admission: EM | Admit: 2016-05-14 | Discharge: 2016-05-14 | Discharge status: Against Medical Advice | Payer: No Typology Code available for payment source

## 2016-05-14 NOTE — ED Notes (Signed)
Pt located in peds waiting area
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
## 2016-05-18 ENCOUNTER — Encounter (HOSPITAL_COMMUNITY): Payer: Self-pay | Admitting: Emergency Medicine

## 2016-05-18 ENCOUNTER — Emergency Department (HOSPITAL_COMMUNITY): Payer: Medicaid Other

## 2016-05-18 ENCOUNTER — Emergency Department (HOSPITAL_COMMUNITY)
Admission: EM | Admit: 2016-05-18 | Discharge: 2016-05-19 | Disposition: A | Discharge status: Home / Self Care | Payer: Medicaid Other | Attending: Emergency Medicine | Admitting: Emergency Medicine

## 2016-05-18 DIAGNOSIS — Y929 Unspecified place or not applicable: Secondary | ICD-10-CM | POA: Diagnosis not present

## 2016-05-18 DIAGNOSIS — M545 Low back pain: Secondary | ICD-10-CM | POA: Insufficient documentation

## 2016-05-18 DIAGNOSIS — F17210 Nicotine dependence, cigarettes, uncomplicated: Secondary | ICD-10-CM | POA: Diagnosis not present

## 2016-05-18 DIAGNOSIS — W01198A Fall on same level from slipping, tripping and stumbling with subsequent striking against other object, initial encounter: Secondary | ICD-10-CM | POA: Insufficient documentation

## 2016-05-18 DIAGNOSIS — S0990XA Unspecified injury of head, initial encounter: Secondary | ICD-10-CM | POA: Insufficient documentation

## 2016-05-18 DIAGNOSIS — Y939 Activity, unspecified: Secondary | ICD-10-CM | POA: Diagnosis not present

## 2016-05-18 DIAGNOSIS — Z760 Encounter for issue of repeat prescription: Secondary | ICD-10-CM | POA: Diagnosis not present

## 2016-05-18 DIAGNOSIS — M542 Cervicalgia: Secondary | ICD-10-CM | POA: Diagnosis not present

## 2016-05-18 DIAGNOSIS — Y999 Unspecified external cause status: Secondary | ICD-10-CM | POA: Insufficient documentation

## 2016-05-18 DIAGNOSIS — W19XXXA Unspecified fall, initial encounter: Secondary | ICD-10-CM

## 2016-05-18 MED ORDER — OXYCODONE HCL 5 MG PO TABS: 5.0000 mg | ORAL_TABLET | Freq: Two times a day (BID) | ORAL | 0 refills | Status: AC | PRN

## 2016-05-18 MED ORDER — ETODOLAC 200 MG PO CAPS: 200.0000 mg | ORAL_CAPSULE | Freq: Two times a day (BID) | ORAL | 0 refills | Status: AC

## 2016-05-18 MED ORDER — TIZANIDINE HCL 2 MG PO TABS: 2.0000 mg | ORAL_TABLET | Freq: Three times a day (TID) | ORAL | 0 refills | Status: AC | PRN

## 2016-05-18 MED ORDER — GABAPENTIN 300 MG PO CAPS: 300.0000 mg | ORAL_CAPSULE | Freq: Three times a day (TID) | ORAL | 0 refills | Status: DC

## 2016-05-18 MED ORDER — GABAPENTIN 300 MG PO CAPS
300.0000 mg | ORAL_CAPSULE | Freq: Three times a day (TID) | ORAL | Status: DC
  Administered 2016-05-18: 300 mg via ORAL
  Filled 2016-05-18: qty 1

## 2016-05-18 MED ORDER — OXYCODONE HCL 5 MG PO TABS
5.0000 mg | ORAL_TABLET | Freq: Two times a day (BID) | ORAL | Status: DC | PRN
  Administered 2016-05-18: 5 mg via ORAL
  Filled 2016-05-18: qty 1

## 2016-05-18 MED ORDER — TIZANIDINE HCL 4 MG PO TABS
2.0000 mg | ORAL_TABLET | Freq: Three times a day (TID) | ORAL | Status: DC | PRN
  Administered 2016-05-18: 2 mg via ORAL
  Filled 2016-05-18: qty 1

## 2016-05-18 MED ORDER — ETODOLAC 200 MG PO CAPS
200.0000 mg | ORAL_CAPSULE | Freq: Two times a day (BID) | ORAL | Status: DC
  Administered 2016-05-19: 200 mg via ORAL
  Filled 2016-05-18: qty 1
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
## 2016-05-18 NOTE — ED Notes (Signed)
Spoke to Dr. Corlis LeakMackuen about pt.  Will order CT scans.

## 2016-05-18 NOTE — Discharge Instructions (Signed)
You have been given 30 days.  Refills on your medication and a five-day supply of oxycodone.  He been given referrals to community [facility] so that you can either a primary care physician and a referral to Dr. Sharene SkeansHickling, so that you can establish neurology follow-up.  Please make appointments and try to keep them.

## 2016-05-19 NOTE — ED Notes (Signed)
Patient verbalized understanding of discharge instructions and denies any further needs or questions at this time. VS stable. Patient ambulatory with steady gait and use of his cane. Escorted to ED entrance in wheelchair.

## 2016-05-25 ENCOUNTER — Ambulatory Visit: Payer: Medicaid Other | Attending: Internal Medicine | Admitting: Physician Assistant

## 2016-05-25 ENCOUNTER — Encounter: Payer: Self-pay | Admitting: Licensed Clinical Social Worker

## 2016-05-25 VITALS — BP 127/72 | HR 69 | Temp 98.3°F | Resp 20 | Ht 72.0 in | Wt 160.0 lb

## 2016-05-25 DIAGNOSIS — W000XXA Fall on same level due to ice and snow, initial encounter: Secondary | ICD-10-CM | POA: Insufficient documentation

## 2016-05-25 DIAGNOSIS — R569 Unspecified convulsions: Secondary | ICD-10-CM | POA: Insufficient documentation

## 2016-05-25 DIAGNOSIS — W3400XA Accidental discharge from unspecified firearms or gun, initial encounter: Secondary | ICD-10-CM | POA: Diagnosis not present

## 2016-05-25 DIAGNOSIS — G8929 Other chronic pain: Secondary | ICD-10-CM | POA: Insufficient documentation

## 2016-05-25 DIAGNOSIS — F17200 Nicotine dependence, unspecified, uncomplicated: Secondary | ICD-10-CM | POA: Diagnosis not present

## 2016-05-25 DIAGNOSIS — Z79899 Other long term (current) drug therapy: Secondary | ICD-10-CM | POA: Insufficient documentation

## 2016-05-25 DIAGNOSIS — F319 Bipolar disorder, unspecified: Secondary | ICD-10-CM | POA: Diagnosis not present

## 2016-05-25 DIAGNOSIS — M542 Cervicalgia: Secondary | ICD-10-CM

## 2016-05-25 MED ORDER — DULOXETINE HCL 30 MG PO CPEP: 30.0000 mg | ORAL_CAPSULE | Freq: Every day | ORAL | 3 refills | Status: AC

## 2016-05-25 MED ORDER — OXYCODONE-ACETAMINOPHEN 5-325 MG PO TABS: 1.0000 | ORAL_TABLET | Freq: Three times a day (TID) | ORAL | 0 refills | Status: AC | PRN

## 2016-05-25 NOTE — Progress Notes (Signed)
Follow up head injury Took last oxycodone this morning.
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
## 2016-07-12 ENCOUNTER — Ambulatory Visit: Payer: Self-pay | Admitting: Family Medicine

## 2016-07-22 ENCOUNTER — Telehealth: Payer: Self-pay | Admitting: General Practice

## 2016-07-22 NOTE — Telephone Encounter (Signed)
Patients mother calling [facility] Pts mother states he is not allowed narcotics while incarcerated so is requesting an alternative Rx for his pain and his anxiety and depression  States they have given pt Tylenol #3 but has not supplied pt with any medication for depression and anxiety

## 2016-07-28 NOTE — Telephone Encounter (Signed)
Pts mother calling to follow up on alternative prescriptions for Pt   [facility], last saw Scot Juniffany Noel

## 2016-08-01 NOTE — Telephone Encounter (Signed)
It is difficult to manage patient remotely without proper medical assessment. Patient needs to see a provider for ongoing treatment of depression and anxiety.

## 2018-08-14 ENCOUNTER — Ambulatory Visit: Payer: Self-pay | Admitting: Internal Medicine

## 2018-10-01 ENCOUNTER — Ambulatory Visit: Payer: Self-pay | Admitting: Internal Medicine

## 2019-02-10 ENCOUNTER — Emergency Department
Admission: EM | Admit: 2019-02-10 | Discharge: 2019-02-10 | Disposition: A | Discharge status: Home / Self Care | Payer: No Typology Code available for payment source | Attending: Emergency Medicine | Admitting: Emergency Medicine

## 2019-02-10 ENCOUNTER — Emergency Department: Payer: No Typology Code available for payment source

## 2019-02-10 ENCOUNTER — Other Ambulatory Visit: Payer: Self-pay

## 2019-02-10 DIAGNOSIS — Y9389 Activity, other specified: Secondary | ICD-10-CM | POA: Diagnosis not present

## 2019-02-10 DIAGNOSIS — F17210 Nicotine dependence, cigarettes, uncomplicated: Secondary | ICD-10-CM | POA: Diagnosis not present

## 2019-02-10 DIAGNOSIS — Y999 Unspecified external cause status: Secondary | ICD-10-CM | POA: Diagnosis not present

## 2019-02-10 DIAGNOSIS — S39012A Strain of muscle, fascia and tendon of lower back, initial encounter: Secondary | ICD-10-CM | POA: Diagnosis not present

## 2019-02-10 DIAGNOSIS — Y92410 Unspecified street and highway as the place of occurrence of the external cause: Secondary | ICD-10-CM | POA: Diagnosis not present

## 2019-02-10 DIAGNOSIS — S161XXA Strain of muscle, fascia and tendon at neck level, initial encounter: Secondary | ICD-10-CM | POA: Insufficient documentation

## 2019-02-10 DIAGNOSIS — S3992XA Unspecified injury of lower back, initial encounter: Secondary | ICD-10-CM | POA: Diagnosis present

## 2019-02-10 DIAGNOSIS — Z79899 Other long term (current) drug therapy: Secondary | ICD-10-CM | POA: Insufficient documentation

## 2019-02-10 MED ORDER — CYCLOBENZAPRINE HCL 5 MG PO TABS: 5.0000 mg | ORAL_TABLET | Freq: Three times a day (TID) | ORAL | 0 refills | Status: AC | PRN

## 2019-02-10 MED ORDER — GABAPENTIN 300 MG PO CAPS: 300.0000 mg | ORAL_CAPSULE | Freq: Every day | ORAL | 0 refills | Status: AC

## 2019-02-10 MED ORDER — IBUPROFEN 600 MG PO TABS: 600.0000 mg | ORAL_TABLET | Freq: Four times a day (QID) | ORAL | 0 refills | Status: AC | PRN

## 2019-02-10 MED ORDER — CYCLOBENZAPRINE HCL 10 MG PO TABS
10.0000 mg | ORAL_TABLET | Freq: Once | ORAL | Status: AC
  Administered 2019-02-10: 10 mg via ORAL
  Filled 2019-02-10: qty 1

## 2019-02-10 MED ORDER — IBUPROFEN 800 MG PO TABS
800.0000 mg | ORAL_TABLET | Freq: Once | ORAL | Status: AC
  Administered 2019-02-10: 800 mg via ORAL
  Filled 2019-02-10: qty 1
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
## 2019-02-10 NOTE — ED Notes (Signed)
Pt requesting Neurontin. Will notify provider.
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
## 2019-02-10 NOTE — Discharge Instructions (Addendum)
Please take medications as prescribed as needed for pain.  Return to the ER for any worsening symptoms or urgent changes in your health.
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
## 2019-09-02 ENCOUNTER — Other Ambulatory Visit: Payer: Self-pay

## 2020-03-11 ENCOUNTER — Encounter: Payer: Medicaid Other | Admitting: Internal Medicine

## 2020-08-25 ENCOUNTER — Encounter: Payer: Self-pay | Admitting: Physician Assistant

## 2020-08-25 ENCOUNTER — Other Ambulatory Visit: Payer: Self-pay

## 2020-08-25 ENCOUNTER — Ambulatory Visit: Payer: Self-pay | Admitting: Physician Assistant

## 2020-08-25 DIAGNOSIS — Z113 Encounter for screening for infections with a predominantly sexual mode of transmission: Secondary | ICD-10-CM

## 2020-08-25 LAB — GRAM STAIN
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
## 2020-08-30 LAB — GONOCOCCUS CULTURE

## 2020-08-31 LAB — HM HIV SCREENING LAB: HM HIV Screening: NEGATIVE

## 2021-03-24 ENCOUNTER — Emergency Department: Payer: Medicaid Other

## 2021-03-24 ENCOUNTER — Emergency Department
Admission: EM | Admit: 2021-03-24 | Discharge: 2021-03-24 | Disposition: A | Discharge status: Home / Self Care | Payer: Medicaid Other | Attending: Emergency Medicine | Admitting: Emergency Medicine

## 2021-03-24 ENCOUNTER — Encounter: Payer: Self-pay | Admitting: *Deleted

## 2021-03-24 ENCOUNTER — Other Ambulatory Visit: Payer: Self-pay

## 2021-03-24 DIAGNOSIS — M549 Dorsalgia, unspecified: Secondary | ICD-10-CM | POA: Insufficient documentation

## 2021-03-24 DIAGNOSIS — G8929 Other chronic pain: Secondary | ICD-10-CM | POA: Insufficient documentation

## 2021-03-24 DIAGNOSIS — F17210 Nicotine dependence, cigarettes, uncomplicated: Secondary | ICD-10-CM | POA: Insufficient documentation

## 2021-03-24 DIAGNOSIS — B349 Viral infection, unspecified: Secondary | ICD-10-CM

## 2021-03-24 DIAGNOSIS — J101 Influenza due to other identified influenza virus with other respiratory manifestations: Secondary | ICD-10-CM | POA: Insufficient documentation

## 2021-03-24 DIAGNOSIS — Z20822 Contact with and (suspected) exposure to covid-19: Secondary | ICD-10-CM | POA: Insufficient documentation

## 2021-03-24 LAB — RESP PANEL BY RT-PCR (FLU A&B, COVID) ARPGX2
Influenza A by PCR: POSITIVE — AB
Influenza B by PCR: NEGATIVE
SARS Coronavirus 2 by RT PCR: NEGATIVE

## 2021-03-24 MED ORDER — ACETAMINOPHEN 500 MG PO TABS
ORAL_TABLET | ORAL | Status: AC
  Filled 2021-03-24: qty 1

## 2021-03-24 MED ORDER — ACETAMINOPHEN 500 MG PO TABS
1000.0000 mg | ORAL_TABLET | Freq: Once | ORAL | Status: AC
  Administered 2021-03-24: 1000 mg via ORAL

## 2021-03-24 MED ORDER — KETOROLAC TROMETHAMINE 30 MG/ML IJ SOLN
30.0000 mg | Freq: Once | INTRAMUSCULAR | Status: AC
  Administered 2021-03-24: 30 mg via INTRAMUSCULAR
  Filled 2021-03-24: qty 1
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
## 2021-03-24 NOTE — ED Notes (Signed)
ED Provider at bedside. 
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
